# Patient Record
Sex: Female | Born: 1990
Health system: Southern US, Community
[De-identification: ages and names within clinical notes are randomized; demographics above are authoritative.]

## PROBLEM LIST (undated history)

## (undated) DIAGNOSIS — N921 Excessive and frequent menstruation with irregular cycle: Secondary | ICD-10-CM

## (undated) DIAGNOSIS — D169 Benign neoplasm of bone and articular cartilage, unspecified: Secondary | ICD-10-CM

## (undated) HISTORY — PX: BARIATRIC SURGERY: SHX1103

---
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
## 2011-05-08 ENCOUNTER — Emergency Department: Payer: Self-pay | Admitting: Emergency Medicine

## 2011-05-08 LAB — COMPREHENSIVE METABOLIC PANEL
Albumin: 3.6 g/dL (ref 3.4–5.0)
Alkaline Phosphatase: 100 U/L (ref 50–136)
BUN: 9 mg/dL (ref 7–18)
Bilirubin,Total: 0.3 mg/dL (ref 0.2–1.0)
Chloride: 109 mmol/L — ABNORMAL HIGH (ref 98–107)
Co2: 24 mmol/L (ref 21–32)
Creatinine: 0.64 mg/dL (ref 0.60–1.30)
EGFR (African American): >60
Glucose: 99 mg/dL (ref 65–99)
Osmolality: 280 (ref 275–301)
SGOT(AST): 17 U/L (ref 15–37)
SGPT (ALT): 25 U/L
Total Protein: 7.5 g/dL (ref 6.4–8.2)

## 2011-05-08 LAB — CBC
HCT: 38.8 % (ref 35.0–47.0)
HGB: 12.3 g/dL (ref 12.0–16.0)
MCHC: 31.6 g/dL — ABNORMAL LOW (ref 32.0–36.0)
RBC: 4.53 10*6/uL (ref 3.80–5.20)
WBC: 18.2 10*3/uL — ABNORMAL HIGH (ref 3.6–11.0)

## 2011-05-08 LAB — TROPONIN I: Troponin-I: <0.02 ng/mL

## 2011-05-22 ENCOUNTER — Emergency Department: Payer: Self-pay | Admitting: Emergency Medicine

## 2011-05-22 LAB — URINALYSIS, COMPLETE
Bilirubin,UR: NEGATIVE
Blood: NEGATIVE
Glucose,UR: NEGATIVE mg/dL (ref 0–75)
Ketone: NEGATIVE
Leukocyte Esterase: NEGATIVE
Nitrite: NEGATIVE
Ph: 5 (ref 4.5–8.0)
Protein: NEGATIVE
RBC,UR: 1 /HPF (ref 0–5)
Specific Gravity: 1.039 (ref 1.003–1.030)
Squamous Epithelial: 7
WBC UR: 2 /HPF (ref 0–5)

## 2011-05-22 LAB — DRUG SCREEN, URINE
Amphetamines, Ur Screen: NEGATIVE (ref ?–1000)
Barbiturates, Ur Screen: NEGATIVE (ref ?–200)
Cannabinoid 50 Ng, Ur ~~LOC~~: NEGATIVE (ref ?–50)
Cocaine Metabolite,Ur ~~LOC~~: NEGATIVE (ref ?–300)
MDMA (Ecstasy)Ur Screen: NEGATIVE (ref ?–500)
Methadone, Ur Screen: NEGATIVE (ref ?–300)
Opiate, Ur Screen: NEGATIVE (ref ?–300)
Phencyclidine (PCP) Ur S: NEGATIVE (ref ?–25)
Tricyclic, Ur Screen: NEGATIVE (ref ?–1000)

## 2011-05-22 LAB — COMPREHENSIVE METABOLIC PANEL
Albumin: 3.4 g/dL (ref 3.4–5.0)
Anion Gap: 7 (ref 7–16)
BUN: 11 mg/dL (ref 7–18)
Chloride: 107 mmol/L (ref 98–107)
Creatinine: 0.58 mg/dL — ABNORMAL LOW (ref 0.60–1.30)
EGFR (African American): >60
Glucose: 101 mg/dL — ABNORMAL HIGH (ref 65–99)
Osmolality: 277 (ref 275–301)
Sodium: 139 mmol/L (ref 136–145)
Total Protein: 7.4 g/dL (ref 6.4–8.2)

## 2011-05-22 LAB — CBC
HCT: 37.5 % (ref 35.0–47.0)
MCH: 27.4 pg (ref 26.0–34.0)
MCHC: 32.7 g/dL (ref 32.0–36.0)
MCV: 84 fL (ref 80–100)
WBC: 15.2 10*3/uL — ABNORMAL HIGH (ref 3.6–11.0)

## 2011-09-23 ENCOUNTER — Emergency Department: Payer: Self-pay | Admitting: Emergency Medicine

## 2013-02-06 ENCOUNTER — Emergency Department: Payer: Self-pay | Admitting: Emergency Medicine

## 2013-03-18 ENCOUNTER — Emergency Department: Payer: Self-pay | Admitting: Emergency Medicine

## 2013-06-16 ENCOUNTER — Ambulatory Visit: Payer: Self-pay

## 2013-06-27 ENCOUNTER — Emergency Department: Payer: Self-pay | Admitting: Emergency Medicine

## 2013-08-06 ENCOUNTER — Emergency Department: Payer: Self-pay | Admitting: Emergency Medicine

## 2014-04-29 NOTE — Consult Note (Signed)
PATIENT NAME:  Krystal Willis, Krystal Willis MR#:  [id number] DATE OF BIRTH:  1990-06-10  DATE OF CONSULTATION:  05/22/2011  REFERRING PHYSICIAN: Arman Filter, MD CONSULTING PHYSICIAN:  Niklas Chretien K. Nicolasa Ducking, MD  REASON FOR CONSULTATION: Rule out panic attacks.   IDENTIFYING INFORMATION: Krystal Willis is a 24 year old single African American female currently living with her mother in the [place] area. She is unemployed and does not have any children. She has been in a relationship on and off with another female over the past two years.   HISTORY OF PRESENT ILLNESS: Krystal Willis is a 24 year old single African American female with no significant past psychiatric history who was brought to the Emergency Room by EMS for the second time in the past month after hyperventilating and having a syncopal episode at home. The patient says that she had two episodes today where she felt like she couldn't breathe and "passed out". Her family said that she was unconscious and she has no recollection of the event. She had apparently hyperventilated and had shortness of breath prior to passing out. She says that she did have some chest pain and her heart was racing, but she denies feeling anxious prior to the episode. She says that one episode today lasted 15 to 20 minutes and another 20 to 30 minutes. The patient denies any specific triggers for these episodes and says that she was fairly calm throughout the episode. She had one episode similar to this approximately two weeks ago and was brought to the Emergency Room. She was discharged at that time with an inhaler and told that she had some small bronchitis. The patient denies feeling depressed and denies any symptoms of depression including feelings of hopelessness or helplessness, crying spells, difficulties with focus and concentration, anhedonia, change in energy level, or change in appetite. She has had some difficulty sleeping at night for the past several months. She is  morbidly obese but has not ever had a work-up for sleep apnea. The patient denies any history of any prior suicidal thoughts or suicide attempts. She denies any history in the past of anxiety or panic attacks. She denies any psychotic symptoms including auditory or visual hallucinations. She denies any paranoid thoughts or delusions. She has recently lost her job one month ago at [employer] secondary to the economy, but denies that she felt anxious or under any stress from this. She has also been on and off with her girlfriend for the past few months even though they have had a relationship for the past two years. She said she does not worry about this at all and her family actually says that she does not seem to care so much about not working or the problems in the relationship. Psychiatry was consulted to rule out panic attacks. Collateral information from the patient's family indicates that she has not been struggling with any anxiety or panic attacks at home. She denies any illicit drug use or heavy alcohol use. White blood cell count in the Emergency Room was 15.2 and urine tox screen was negative. Ethanol level was not drawn.   PAST PSYCHIATRIC HISTORY: She denies any history of any prior inpatient psychiatric hospitalizations or suicide attempts. She denies any history of any psychotropic medications for mood stability in the past. She denies any heavy substance use in the past.   SUBSTANCE ABUSE HISTORY: She denies any cocaine, opiate, or stimulant use. She says she tried marijuana before in the past but does not like it. She drinks alcohol,  1 to 2 drinks per week. The patient does smoke cigars on and off about once a week. She denies any cigarette use.   FAMILY PSYCHIATRIC HISTORY: She denies any history of any mental illness or substance use in the family.   PAST MEDICAL HISTORY: Obesity. Rule out sleep apnea. She denied a history of any prior TBI or seizures.   OUTPATIENT MEDICATIONS: Inhaler for  bronchitis that was prescribed in the Emergency Room on 05/04, Ventolin inhaler, azithromycin prescribed 05/04.   ALLERGIES: No known drug allergies.   SOCIAL HISTORY: The patient was born and raised in the [place] area by her mother primarily. She knew her father, but her parents were not married. She says she gets along with both of her parents. She has two little sisters, age 99 and 63. She denies any history of any physical or sexual abuse. The patient graduated high school. She did one semester at [employer] and is planning to go to [employer] and study criminal justice. She wants to be a cop. She last worked one month ago at [employer]. She has been in a relationship with another female for the past two years, but they have been on and off over the past several months. She lives with her mother.   LEGAL HISTORY: She denies any history of any arrests or incarcerations.   MENTAL STATUS EXAM: Krystal Willis is a 24 year old obese African American female who is lying fairly comfortably and calmly in her stretcher in the Emergency Room. Her family was at the bedside. She was fully alert and oriented to time, place, and situation. Speech was regular rate and rhythm, fluent and coherent. Mood was described as being "okay" and affect was full and congruent. The patient was able to smile and joke appropriately. Speech was regular rate and rhythm, fluent and coherent. Thought processes were linear, logical, and goal-directed. There was no alteration in mental status. Cognition was grossly intact. She denied any current suicidal or homicidal thoughts. She denied any current auditory or visual hallucinations. She denied any paranoid thoughts or delusions. Recall was 3/3 initially and 3/3 after five minutes. Abstraction was good. She did not have any difficulty naming presidents backwards.   SUICIDE RISK ASSESSMENT: At this time Ms. Intrieri denies any current suicidal thoughts or homicidal thoughts. She denies any current psychotic  symptoms. She does not present as an imminent danger to herself or others. She denies having any access to guns.   REVIEW OF SYSTEMS: CONSTITUTIONAL: She denies any weight changes, fatigue or weakness. She denies any fever, chills, or night sweats. HEAD: She denies any headaches or dizziness. EYES: She denies any diplopia or blurred vision. ENT: She denies any throat pain. She denies any neck pain. She denies any difficulty swallowing. RESPIRATORY: She did have shortness of breath and chest pain earlier prior to the episode. CARDIAC: She did have chest pain and shortness of breath prior to the episode. She denies ever having syncopal episodes prior to today and two weeks ago. GASTROINTESTINAL: She denies any nausea, vomiting, or abdominal pain. She denies any change in bowel movements. GENITOURINARY: She denies any dysuria. She denies any difficulty with incontinence. NEUROLOGICAL: She denies any numbness, but did have some tingling in her upper extremities earlier today when she was hyperventilating. PSYCHIATRIC: Please see history of present illness. She denies any anxiety or depressive symptoms. She denies any psychosis.   PHYSICAL EXAMINATION:  VITAL SIGNS: Blood pressure 101/56, heart rate 77, respirations 18, temperature 98.9, pulse oximetry 98% on room  air. Please see initial physical exam as completed by Dr. Arman Filter.   LABORATORY, DIAGNOSTIC, AND RADIOLOGICAL DATA: Sodium 139, potassium 4.0, chloride 107, CO2 25. BUN 11, creatinine 0.58, glucose 101. Beta HCG less than 1. LFT within normal limits. Troponin less than 0.02. White blood cell count 15.2, hemoglobin 12.2, platelet count 318. Urine tox screen negative for all substances. Urinalysis was nitrate and leukocyte esterase negative, 2 WBC, trace bacteria, 7 epithelial cells.   DIAGNOSES:  AXIS I: No Axis I diagnosis. Rule out anxiety.   AXIS II: Deferred.   AXIS III:  1. Obesity.  2. Rule out syncopal episode.  3. Rule out sleep  apnea.   AXIS IV: Mild to moderate occupational problems, some relationship conflict.   AXIS V: Global assessment of functioning score at present equals 70.  ASSESSMENT AND TREATMENT RECOMMENDATIONS: Ms. Pickler is a 24 year old single African American female who was brought to the Emergency Room after she passed out twice today. The patient did have some hyperventilation prior to passing out and had complained of some chest pain and shortness of breath. She was seen in the Emergency Room on 05/04, two weeks ago and prescribed an inhaler for acute bronchitis. The patient is denying any depressive symptoms, suicidal thoughts or psychotic symptoms. No history of symptoms consistent with bipolar mania. Her description of the events is not strongly suggestive of a panic attack and the patient is denying any triggers for these attacks. Collateral information from the family confirms that the patient has not been under any stress and they have not seen her struggling with any anxiety or psychiatric issues. Would consult cardiology to rule out cardiovascular etiology for syncopal episodes. No psychotropic medication recommendations at this time. The case was discussed with Dr. Jasmine December in the Emergency Room. She is not in imminent danger to herself or others at this time necessitating any inpatient psychiatric hospitalization.   ____________________________ Steva Colder. Nicolasa Ducking, MD akk:ap D: 05/22/2011 18:59:35 ET T: 05/23/2011 08:02:37 ET JOB#: [id number]  cc: Tiphany Fayson K. Nicolasa Ducking, MD, <Dictator> Chauncey Mann MD ELECTRONICALLY SIGNED 05/24/2011 22:47

## 2014-05-09 ENCOUNTER — Emergency Department
Admission: EM | Admit: 2014-05-09 | Discharge: 2014-05-09 | Disposition: A | Discharge status: Home / Self Care | Payer: No Typology Code available for payment source | Attending: Emergency Medicine | Admitting: Emergency Medicine

## 2014-05-09 DIAGNOSIS — Z3202 Encounter for pregnancy test, result negative: Secondary | ICD-10-CM | POA: Diagnosis not present

## 2014-05-09 DIAGNOSIS — N939 Abnormal uterine and vaginal bleeding, unspecified: Secondary | ICD-10-CM | POA: Diagnosis not present

## 2014-05-09 LAB — COMPREHENSIVE METABOLIC PANEL
ALT: 20 U/L (ref 14–54)
ANION GAP: 6 (ref 5–15)
AST: 17 U/L (ref 15–41)
Albumin: 3.8 g/dL (ref 3.5–5.0)
Alkaline Phosphatase: 89 U/L (ref 38–126)
BUN: 7 mg/dL (ref 6–20)
CALCIUM: 8.6 mg/dL — AB (ref 8.9–10.3)
CO2: 25 mmol/L (ref 22–32)
CREATININE: 0.63 mg/dL (ref 0.44–1.00)
Chloride: 105 mmol/L (ref 101–111)
GLUCOSE: 105 mg/dL — AB (ref 65–99)
Potassium: 3.8 mmol/L (ref 3.5–5.1)
Sodium: 136 mmol/L (ref 135–145)
Total Bilirubin: 0.3 mg/dL (ref 0.3–1.2)
Total Protein: 8 g/dL (ref 6.5–8.1)

## 2014-05-09 LAB — POCT PREGNANCY, URINE: Preg Test, Ur: NEGATIVE

## 2014-05-09 LAB — CBC
HCT: 31.8 % — ABNORMAL LOW (ref 35.0–47.0)
HEMOGLOBIN: 10.2 g/dL — AB (ref 12.0–16.0)
MCH: 25.1 pg — AB (ref 26.0–34.0)
MCHC: 32 g/dL (ref 32.0–36.0)
MCV: 78.6 fL — ABNORMAL LOW (ref 80.0–100.0)
PLATELETS: 374 10*3/uL (ref 150–440)
RBC: 4.05 MIL/uL (ref 3.80–5.20)
RDW: 15.1 % — ABNORMAL HIGH (ref 11.5–14.5)
WBC: 16.4 10*3/uL — ABNORMAL HIGH (ref 3.6–11.0)

## 2014-05-09 LAB — URINALYSIS COMPLETE WITH MICROSCOPIC (ARMC ONLY)
Bilirubin Urine: NEGATIVE
GLUCOSE, UA: NEGATIVE mg/dL
Ketones, ur: NEGATIVE mg/dL
Leukocytes, UA: NEGATIVE
Nitrite: NEGATIVE
Protein, ur: 30 mg/dL — AB
Specific Gravity, Urine: 1.024 (ref 1.005–1.030)
pH: 5 (ref 5.0–8.0)

## 2014-05-09 LAB — TSH: TSH: 2.684 u[IU]/mL (ref 0.350–4.500)

## 2014-05-09 MED ORDER — MEDROXYPROGESTERONE ACETATE 10 MG PO TABS: 10.0000 mg | ORAL_TABLET | Freq: Every day | ORAL | Status: DC

## 2014-05-09 MED ORDER — MEDROXYPROGESTERONE ACETATE 10 MG PO TABS
10.0000 mg | ORAL_TABLET | Freq: Every day | ORAL | Status: DC
  Administered 2014-05-09: 10 mg via ORAL
  Filled 2014-05-09 (×2): qty 1

## 2014-05-09 NOTE — ED Notes (Signed)
Reports vaginal bleeding x 1 month, denies pain

## 2014-05-09 NOTE — ED Provider Notes (Signed)
[facility] Emergency Department Provider Note    Time seen: 1:37 PM  I have reviewed the triage vital signs and the nursing notes.   HISTORY  Chief Complaint Vaginal Bleeding    HPI Krystal Willis is a 24 y.o. female since ER four-month vaginal bleeding. Denies any pain denies any other complaints. She states typically her menstrual cycles are irregular but she has never bled for this long. Has never been on birth control. Just does describe recent weight gain. Bleeding is moderate    No past medical history on file.  There are no active problems to display for this patient.   No past surgical history on file.  No current outpatient prescriptions on file.  Allergies Review of patient's allergies indicates no known allergies.  No family history on file.  Social History History  Substance Use Topics  . Smoking status: Not on file  . Smokeless tobacco: Not on file  . Alcohol Use: Not on file    Review of Systems Constitutional: Negative for fever. Eyes: Negative for visual changes. ENT: Negative for sore throat. Cardiovascular: Negative for chest pain. Respiratory: Negative for shortness of breath. Gastrointestinal: Negative for abdominal pain, vomiting and diarrhea. Genitourinary: Negative for dysuria, persistent vaginal bleeding Musculoskeletal: Negative for back pain. Skin: Negative for rash. Neurological: Negative for headaches, focal weakness or numbness.  10-point ROS otherwise negative.  ____________________________________________   PHYSICAL EXAM:  VITAL SIGNS: ED Triage Vitals  Enc Vitals Group     BP 05/09/14 1851 140/87 mmHg     Pulse Rate 05/09/14 1851 97     Resp --      Temp 05/09/14 1851 98.3 F (36.8 C)     Temp Source 05/09/14 1851 Oral     SpO2 05/09/14 1851 97 %     Weight 05/09/14 1853 370 lb (167.831 kg)     Height 05/09/14 1853 5\' 5"  (1.651 m)     Head Cir --      Peak Flow --      Pain Score --      Pain Loc --      Pain Edu? --      Excl. in [place]? --     Constitutional: Alert and oriented. Obese but well appearing and in no distress Eyes: Conjunctivae are normal. PERRL. Normal extraocular movements. ENT   Head: Normocephalic and atraumatic.   Nose: No congestion/rhinnorhea.   Mouth/Throat: Mucous membranes are moist.   Neck: No stridor. Hematological/Lymphatic/Immunilogical: No cervical lymphadenopathy. Cardiovascular: Normal rate, regular rhythm. Normal and symmetric distal pulses are present in all extremities. No murmurs, rubs, or gallops. Respiratory: Normal respiratory effort without tachypnea nor retractions. Breath sounds are clear and equal bilaterally. No wheezes/rales/rhonchi. Gastrointestinal: Soft and nontender. No distention. No abdominal bruits. There is no CVA tenderness. Musculoskeletal: Nontender with normal range of motion in all extremities. No joint effusions.  No lower extremity tenderness nor edema. Neurologic:  Normal speech and language. No gross focal neurologic deficits are appreciated. Speech is normal. No gait instability. Skin:  Skin is warm, dry and intact. No rash noted.  ____________________________________________    LABS (pertinent positives/negatives)  Chronic leukocytosis hemoglobin 10.2  ____________________________________________    RADIOLOGY  None  ____________________________________________    ED COURSE  Pertinent labs & imaging results that were available during my care of the patient were reviewed by me and considered in my medical decision making (see chart for details).  Patient looks well. She was given Provera 10 mg by  mouth  FINAL ASSESSMENT AND PLAN  Assessment: Dysfunctional uterine bleeding  Plan: We'll discharge on 10 day course of Provera. Encouraged follow-up with OB/GYN.    Earleen Newport, MD   Earleen Newport, MD 05/09/14 2114

## 2014-05-09 NOTE — Discharge Instructions (Signed)

## 2014-05-31 ENCOUNTER — Emergency Department
Admission: EM | Admit: 2014-05-31 | Discharge: 2014-05-31 | Disposition: A | Discharge status: Home / Self Care | Payer: Self-pay | Attending: Emergency Medicine | Admitting: Emergency Medicine

## 2014-05-31 DIAGNOSIS — Z3202 Encounter for pregnancy test, result negative: Secondary | ICD-10-CM | POA: Insufficient documentation

## 2014-05-31 DIAGNOSIS — N921 Excessive and frequent menstruation with irregular cycle: Secondary | ICD-10-CM | POA: Insufficient documentation

## 2014-05-31 LAB — CBC WITH DIFFERENTIAL/PLATELET
Basophils Absolute: 0 10*3/uL (ref 0–0.1)
Basophils Relative: 0 %
EOS ABS: 0.1 10*3/uL (ref 0–0.7)
Eosinophils Relative: 1 %
HEMATOCRIT: 29.1 % — AB (ref 35.0–47.0)
Hemoglobin: 9.3 g/dL — ABNORMAL LOW (ref 12.0–16.0)
LYMPHS ABS: 4 10*3/uL — AB (ref 1.0–3.6)
Lymphocytes Relative: 27 %
MCH: 25.1 pg — ABNORMAL LOW (ref 26.0–34.0)
MCHC: 32.1 g/dL (ref 32.0–36.0)
MCV: 78.1 fL — ABNORMAL LOW (ref 80.0–100.0)
MONO ABS: 0.6 10*3/uL (ref 0.2–0.9)
Monocytes Relative: 4 %
Neutro Abs: 10.2 10*3/uL — ABNORMAL HIGH (ref 1.4–6.5)
Neutrophils Relative %: 68 %
Platelets: 374 10*3/uL (ref 150–440)
RBC: 3.73 MIL/uL — ABNORMAL LOW (ref 3.80–5.20)
RDW: 15.5 % — ABNORMAL HIGH (ref 11.5–14.5)
WBC: 14.9 10*3/uL — ABNORMAL HIGH (ref 3.6–11.0)

## 2014-05-31 LAB — POCT PREGNANCY, URINE: Preg Test, Ur: NEGATIVE

## 2014-05-31 LAB — URINALYSIS COMPLETE WITH MICROSCOPIC (ARMC ONLY)
SQUAMOUS EPITHELIAL / LPF: NONE SEEN
Specific Gravity, Urine: 1.033 — ABNORMAL HIGH (ref 1.005–1.030)

## 2014-05-31 MED ORDER — MEDROXYPROGESTERONE ACETATE 10 MG PO TABS: 10.0000 mg | ORAL_TABLET | Freq: Every day | ORAL | Status: DC

## 2014-05-31 MED ORDER — SULFAMETHOXAZOLE-TRIMETHOPRIM 800-160 MG PO TABS: 1.0000 | ORAL_TABLET | Freq: Two times a day (BID) | ORAL | Status: DC

## 2014-05-31 NOTE — ED Notes (Signed)
Patient states that she has been on her menstrual cycle for 3 months. Reports some mild abdominal pain.

## 2014-05-31 NOTE — ED Provider Notes (Addendum)
[facility] Emergency Department Provider Note  ____________________________________________  Time seen: 3:30 PM  I have reviewed the triage vital signs and the nursing notes.   HISTORY  Chief Complaint Vaginal Bleeding    HPI Krystal Willis is a 24 y.o. female who reports continuous menstrual bleeding for about 3 months. She was seen in the ED on May 5 and started on a course of Provera.She took this for a total of 10 days and noted that the bleeding stopped while taking it, and then when she ran out of the medication the bleeding started again. It is not been going on for 12 days and getting worse. She's noted a few small clots. No dizziness lightheadedness passing out chest pain shortness of breath fevers or chills.     History reviewed. No pertinent past medical history.  There are no active problems to display for this patient.   History reviewed. No pertinent past surgical history.  Current Outpatient Rx  Name  Route  Sig  Dispense  Refill  . medroxyPROGESTERone (PROVERA) 10 MG tablet   Oral   Take 1 tablet (10 mg total) by mouth daily.   10 tablet   0   . medroxyPROGESTERone (PROVERA) 10 MG tablet   Oral   Take 1 tablet (10 mg total) by mouth daily.   10 tablet   0     Allergies Review of patient's allergies indicates no known allergies.  No family history on file.  Social History History  Substance Use Topics  . Smoking status: Never Smoker   . Smokeless tobacco: Not on file  . Alcohol Use: No    Review of Systems  Constitutional: No fever or chills. No weight changes Eyes:No blurry vision or double vision.  ENT: No sore throat. Cardiovascular: No chest pain. Respiratory: No dyspnea or cough. Gastrointestinal: Negative for abdominal pain, vomiting and diarrhea.  No BRBPR or melena. Genitourinary: No dysuria but does have urinary frequency. Positive vaginal bleeding Musculoskeletal: Negative for back pain. No joint  swelling or pain. Skin: Negative for rash. Neurological: Negative for headaches, focal weakness or numbness. Psychiatric:No anxiety or depression.   Endocrine:No hot/cold intolerance, changes in energy, or sleep difficulty.  10-point ROS otherwise negative.  ____________________________________________   PHYSICAL EXAM:  VITAL SIGNS: ED Triage Vitals  Enc Vitals Group     BP 05/31/14 1509 157/93 mmHg     Pulse Rate 05/31/14 1509 116     Resp 05/31/14 1509 15     Temp 05/31/14 1509 98.3 F (36.8 C)     Temp Source 05/31/14 1509 Oral     SpO2 05/31/14 1509 98 %     Weight 05/31/14 1509 320 lb (145.151 kg)     Height 05/31/14 1509 5\' 5"  (1.651 m)     Head Cir --      Peak Flow --      Pain Score 05/31/14 1510 5     Pain Loc --      Pain Edu? --      Excl. in [place]? --      Constitutional: Alert and oriented. Well appearing and in no distress. Eyes: No scleral icterus. No conjunctival pallor. PERRL. EOMI ENT   Head: Normocephalic and atraumatic.   Nose: No congestion/rhinnorhea. No septal hematoma   Mouth/Throat: MMM, no pharyngeal erythema. No peritonsillar mass. No uvula shift.   Neck: No stridor. No SubQ emphysema. No meningismus. Acanthosis nigricans Hematological/Lymphatic/Immunilogical: No cervical lymphadenopathy. Cardiovascular: RRR. Normal and symmetric distal pulses  are present in all extremities. No murmurs, rubs, or gallops. Respiratory: Normal respiratory effort without tachypnea nor retractions. Breath sounds are clear and equal bilaterally. No wheezes/rales/rhonchi. Gastrointestinal: Soft and nontender. No distention. There is no CVA tenderness.  No rebound, rigidity, or guarding. Genitourinary: deferred Musculoskeletal: Nontender with normal range of motion in all extremities. No joint effusions.  No lower extremity tenderness.  No edema. Neurologic:   Normal speech and language.  CN 2-10 normal. Motor grossly intact. No pronator drift.  Normal  gait. No gross focal neurologic deficits are appreciated.  Skin:  Skin is warm, dry and intact. No rash noted.  No petechiae, purpura, or bullae. Psychiatric: Mood and affect are normal. Speech and behavior are normal. Patient exhibits appropriate insight and judgment.  ____________________________________________    LABS (pertinent positives/negatives) (all labs ordered are listed, but only abnormal results are displayed) Labs Reviewed  CBC WITH DIFFERENTIAL/PLATELET - Abnormal; Notable for the following:    WBC 14.9 (*)    RBC 3.73 (*)    Hemoglobin 9.3 (*)    HCT 29.1 (*)    MCV 78.1 (*)    MCH 25.1 (*)    RDW 15.5 (*)    Neutro Abs 10.2 (*)    Lymphs Abs 4.0 (*)    All other components within normal limits  URINALYSIS COMPLETEWITH MICROSCOPIC ([facility] ONLY) - Abnormal; Notable for the following:    Bacteria, UA FEW (*)    All other components within normal limits  POC URINE PREG, ED  POCT PREGNANCY, URINE   ____________________________________________   EKG    ____________________________________________    RADIOLOGY    ____________________________________________   PROCEDURES  ____________________________________________   INITIAL IMPRESSION / ASSESSMENT AND PLAN / ED COURSE  Pertinent labs & imaging results that were available during my care of the patient were reviewed by me and considered in my medical decision making (see chart for details).  Evaluation consisted consistent with menometrorrhagia. The patient has called the [facility] of [facility] GYN clinic for follow-up and is awaiting an appointment. She otherwise is well-appearing no acute distress, does have some features suggestive of PCO S, which will be best managed by the OB/GYN clinic. I'll start her on another course of Provera for symptom control at this time and let her follow-up with gynecology. No evidence of ectopic pregnancy or pregnancy test is negative. We'll also give Bactrim for  UTI.  ____________________________________________   FINAL CLINICAL IMPRESSION(S) / ED DIAGNOSES  Final diagnoses:  Metrorrhagia   urinary tract infection    Carrie Mew, MD 05/31/14 [place], MD 05/31/14 [phone]

## 2014-05-31 NOTE — ED Notes (Signed)
Pt alert and oriented X4, active, cooperative, pt in NAD. RR even and unlabored, color WNL.  Pt informed to return if any life threatening symptoms occur.   

## 2014-05-31 NOTE — Discharge Instructions (Signed)

## 2014-06-03 LAB — URINE CULTURE: Culture: 5000

## 2014-06-11 ENCOUNTER — Encounter: Payer: Self-pay | Admitting: Emergency Medicine

## 2014-06-11 ENCOUNTER — Emergency Department
Admission: EM | Admit: 2014-06-11 | Discharge: 2014-06-11 | Disposition: A | Discharge status: Home / Self Care | Payer: Self-pay | Attending: Emergency Medicine | Admitting: Emergency Medicine

## 2014-06-11 DIAGNOSIS — N939 Abnormal uterine and vaginal bleeding, unspecified: Secondary | ICD-10-CM

## 2014-06-11 DIAGNOSIS — D500 Iron deficiency anemia secondary to blood loss (chronic): Secondary | ICD-10-CM

## 2014-06-11 DIAGNOSIS — Z792 Long term (current) use of antibiotics: Secondary | ICD-10-CM | POA: Insufficient documentation

## 2014-06-11 DIAGNOSIS — Z793 Long term (current) use of hormonal contraceptives: Secondary | ICD-10-CM | POA: Insufficient documentation

## 2014-06-11 DIAGNOSIS — Z87891 Personal history of nicotine dependence: Secondary | ICD-10-CM | POA: Insufficient documentation

## 2014-06-11 LAB — CBC WITH DIFFERENTIAL/PLATELET
Basophils Absolute: 0 10*3/uL (ref 0–0.1)
Basophils Relative: 0 %
EOS ABS: 0.3 10*3/uL (ref 0–0.7)
EOS PCT: 2 %
HCT: 28.2 % — ABNORMAL LOW (ref 35.0–47.0)
Hemoglobin: 8.6 g/dL — ABNORMAL LOW (ref 12.0–16.0)
Lymphocytes Relative: 28 %
Lymphs Abs: 3.8 10*3/uL — ABNORMAL HIGH (ref 1.0–3.6)
MCH: 23.6 pg — AB (ref 26.0–34.0)
MCHC: 30.4 g/dL — ABNORMAL LOW (ref 32.0–36.0)
MCV: 77.5 fL — ABNORMAL LOW (ref 80.0–100.0)
MONOS PCT: 5 %
Monocytes Absolute: 0.6 10*3/uL (ref 0.2–0.9)
NEUTROS PCT: 65 %
Neutro Abs: 8.9 10*3/uL — ABNORMAL HIGH (ref 1.4–6.5)
Platelets: 445 10*3/uL — ABNORMAL HIGH (ref 150–440)
RBC: 3.64 MIL/uL — ABNORMAL LOW (ref 3.80–5.20)
RDW: 15.6 % — ABNORMAL HIGH (ref 11.5–14.5)
WBC: 13.6 10*3/uL — ABNORMAL HIGH (ref 3.6–11.0)

## 2014-06-11 MED ORDER — OXYCODONE-ACETAMINOPHEN 5-325 MG PO TABS: 1.0000 | ORAL_TABLET | Freq: Four times a day (QID) | ORAL | Status: DC | PRN

## 2014-06-11 MED ORDER — OXYCODONE-ACETAMINOPHEN 5-325 MG PO TABS
ORAL_TABLET | ORAL | Status: AC
  Administered 2014-06-11: 2 via ORAL
  Filled 2014-06-11: qty 2

## 2014-06-11 MED ORDER — OXYCODONE-ACETAMINOPHEN 5-325 MG PO TABS
2.0000 | ORAL_TABLET | Freq: Once | ORAL | Status: AC
  Administered 2014-06-11: 2 via ORAL

## 2014-06-11 MED ORDER — NORGESTIMATE-ETH ESTRADIOL 0.25-35 MG-MCG PO TABS: 1.0000 | ORAL_TABLET | Freq: Every day | ORAL | Status: DC

## 2014-06-11 NOTE — ED Notes (Signed)
States has had vag bleed x 1 month, has been seen here before and RX meds but has not stopped, denies chance of pregnancy

## 2014-06-11 NOTE — Discharge Instructions (Signed)
Abnormal Uterine Bleeding Abnormal uterine bleeding can affect women at various stages in life, including teenagers, women in their reproductive years, pregnant women, and women who have reached menopause. Several kinds of uterine bleeding are considered abnormal, including:  Bleeding or spotting between periods.   Bleeding after sexual intercourse.   Bleeding that is heavier or more than normal.   Periods that last longer than usual.  Bleeding after menopause.  Many cases of abnormal uterine bleeding are minor and simple to treat, while others are more serious. Any type of abnormal bleeding should be evaluated by your health care provider. Treatment will depend on the cause of the bleeding. HOME CARE INSTRUCTIONS Monitor your condition for any changes. The following actions may help to alleviate any discomfort you are experiencing:  Avoid the use of tampons and douches as directed by your health care provider.  Change your pads frequently. You should get regular pelvic exams and Pap tests. Keep all follow-up appointments for diagnostic tests as directed by your health care provider.  SEEK MEDICAL CARE IF:   Your bleeding lasts more than 1 week.   You feel dizzy at times.  SEEK IMMEDIATE MEDICAL CARE IF:   You pass out.   You are changing pads every 15 to 30 minutes.   You have abdominal pain.  You have a fever.   You become sweaty or weak.   You are passing large blood clots from the vagina.   You start to feel nauseous and vomit. MAKE SURE YOU:   Understand these instructions.  Will watch your condition.  Will get help right away if you are not doing well or get worse. Document Released: 12/22/2004 Document Revised: 12/27/2012 Document Reviewed: 07/21/2012 [employer] Patient Information 2015 [employer]. This information is not intended to replace advice given to you by your health care provider. Make sure you discuss any questions you have with your  health care provider.  

## 2014-06-11 NOTE — ED Provider Notes (Signed)
[facility] Emergency Department Provider Note     Time seen: ----------------------------------------- 2:20 PM on 06/11/2014 -----------------------------------------    I have reviewed the triage vital signs and the nursing notes.   HISTORY  Chief Complaint Vaginal Bleeding    HPI Krystal Willis is a 24 y.o. female who presents ER for heavy bleeding for last month. Patient states this is her third visit here for same. She's had 2 rounds of Provera, bleeding initially stopped but then recur. Patient denies any chance she is pregnant, having severe pelvic pain this time. Nothing seems to make her symptoms better or worse. Denies fevers chills or other complaints. States that she did have a UTI last time as well, the medication she was given last admitted her very nauseated     History reviewed. No pertinent past medical history.  There are no active problems to display for this patient.   No past surgical history on file.  Current Outpatient Rx  Name  Route  Sig  Dispense  Refill  . medroxyPROGESTERone (PROVERA) 10 MG tablet   Oral   Take 1 tablet (10 mg total) by mouth daily.   10 tablet   0   . medroxyPROGESTERone (PROVERA) 10 MG tablet   Oral   Take 1 tablet (10 mg total) by mouth daily.   10 tablet   0   . sulfamethoxazole-trimethoprim (BACTRIM DS) 800-160 MG per tablet   Oral   Take 1 tablet by mouth 2 (two) times daily.   14 tablet   0     Allergies Review of patient's allergies indicates no known allergies.  No family history on file.  Social History History  Substance Use Topics  . Smoking status: Former Research scientist (life sciences)  . Smokeless tobacco: Not on file  . Alcohol Use: No    Review of Systems Constitutional: Negative for fever. Eyes: Negative for visual changes. ENT: Negative for sore throat. Cardiovascular: Negative for chest pain. Respiratory: Negative for shortness of breath. Gastrointestinal: Negative for abdominal  pain, vomiting and diarrhea. Genitourinary: Negative for dysuria. Positive for persistent heavy vaginal bleeding and passing clots Musculoskeletal: Negative for back pain. Skin: Negative for rash. Neurological: Negative for headaches, focal weakness or numbness.  10-point ROS otherwise negative.  ____________________________________________   PHYSICAL EXAM:  VITAL SIGNS: ED Triage Vitals  Enc Vitals Group     BP 06/11/14 1150 138/86 mmHg     Pulse Rate 06/11/14 1150 78     Resp 06/11/14 1150 20     Temp 06/11/14 1150 98.5 F (36.9 C)     Temp Source 06/11/14 1150 Oral     SpO2 06/11/14 1150 100 %     Weight 06/11/14 1150 320 lb (145.151 kg)     Height 06/11/14 1150 5\' 6"  (1.676 m)     Head Cir --      Peak Flow --      Pain Score 06/11/14 1152 10     Pain Loc --      Pain Edu? --      Excl. in [place]? --     Constitutional: Alert and oriented. Well appearing and in no distress. Eyes: Conjunctivae are normal. PERRL. Normal extraocular movements. ENT   Head: Normocephalic and atraumatic.   Nose: No congestion/rhinnorhea.   Mouth/Throat: Mucous membranes are moist.   Neck: No stridor. Hematological/Lymphatic/Immunilogical: No cervical lymphadenopathy. Cardiovascular: Normal rate, regular rhythm. Normal and symmetric distal pulses are present in all extremities. No murmurs, rubs, or gallops. Respiratory: Normal  respiratory effort without tachypnea nor retractions. Breath sounds are clear and equal bilaterally. No wheezes/rales/rhonchi. Gastrointestinal: Soft and nontender. No distention. No abdominal bruits. There is no CVA tenderness. Musculoskeletal: Nontender with normal range of motion in all extremities. No joint effusions.  No lower extremity tenderness nor edema. Neurologic:  Normal speech and language. No gross focal neurologic deficits are appreciated. Speech is normal. No gait instability. Skin:  Skin is warm, dry and intact, pallor Psychiatric: Mood and  affect are normal. Speech and behavior are normal. Patient exhibits appropriate insight and judgment.  ____________________________________________    LABS (pertinent positives/negatives)  Labs Reviewed  CBC WITH DIFFERENTIAL/PLATELET - Abnormal; Notable for the following:    WBC 13.6 (*)    RBC 3.64 (*)    Hemoglobin 8.6 (*)    HCT 28.2 (*)    MCV 77.5 (*)    MCH 23.6 (*)    MCHC 30.4 (*)    RDW 15.6 (*)    Platelets 445 (*)    Neutro Abs 8.9 (*)    Lymphs Abs 3.8 (*)    All other components within normal limits   ____________________________________________  ED COURSE:  Pertinent labs & imaging results that were available during my care of the patient were reviewed by me and considered in my medical decision making (see chart for details).  Were discussed with OB/GYN for assistance in medicating this patient. She has been an unable to get in with OB/GYN doctor ____________________________________________   RADIOLOGY  None  ____________________________________________    FINAL ASSESSMENT AND PLAN  Abnormal vaginal bleeding and anemia  Plan: Discussed the case with Dr. Leafy Ro who is on-call for OB/GYN. Patient will be written for a Sprintec taper. Her hemoglobin has dropped, but not significantly since the past visit. She will follow-up in the next several days with Dr. Leafy Ro OB/GYN.    Earleen Newport, MD   Earleen Newport, MD 06/11/14 [phone]

## 2014-06-29 ENCOUNTER — Encounter: Payer: Self-pay | Admitting: Emergency Medicine

## 2014-06-29 ENCOUNTER — Emergency Department
Admission: EM | Admit: 2014-06-29 | Discharge: 2014-06-29 | Disposition: A | Discharge status: Home / Self Care | Payer: Self-pay | Attending: Emergency Medicine | Admitting: Emergency Medicine

## 2014-06-29 DIAGNOSIS — Z79899 Other long term (current) drug therapy: Secondary | ICD-10-CM | POA: Insufficient documentation

## 2014-06-29 DIAGNOSIS — N938 Other specified abnormal uterine and vaginal bleeding: Secondary | ICD-10-CM | POA: Insufficient documentation

## 2014-06-29 DIAGNOSIS — Z792 Long term (current) use of antibiotics: Secondary | ICD-10-CM | POA: Insufficient documentation

## 2014-06-29 DIAGNOSIS — Z87891 Personal history of nicotine dependence: Secondary | ICD-10-CM | POA: Insufficient documentation

## 2014-06-29 LAB — CBC
HCT: 25.6 % — ABNORMAL LOW (ref 35.0–47.0)
Hemoglobin: 8.4 g/dL — ABNORMAL LOW (ref 12.0–16.0)
MCH: 24.9 pg — AB (ref 26.0–34.0)
MCHC: 32.8 g/dL (ref 32.0–36.0)
MCV: 75.9 fL — ABNORMAL LOW (ref 80.0–100.0)
PLATELETS: 420 10*3/uL (ref 150–440)
RBC: 3.37 MIL/uL — ABNORMAL LOW (ref 3.80–5.20)
RDW: 15.8 % — AB (ref 11.5–14.5)
WBC: 12.1 10*3/uL — ABNORMAL HIGH (ref 3.6–11.0)

## 2014-06-29 MED ORDER — NORGESTIMATE-ETH ESTRADIOL 0.25-35 MG-MCG PO TABS: 1.0000 | ORAL_TABLET | Freq: Every day | ORAL | Status: DC

## 2014-06-29 NOTE — Discharge Instructions (Signed)

## 2014-06-29 NOTE — ED Provider Notes (Signed)
[facility] Emergency Department Provider Note  ____________________________________________  Time seen: On arrival  I have reviewed the triage vital signs and the nursing notes.   HISTORY  Chief Complaint Vaginal Bleeding     HPI Krystal Willis is a 24 y.o. female who presents with long history of chronic heavy bleeding. She was placed on birth control pills by one of our ED physicians earlier in the month and she had significant success with this with complete resolution of bleeding and cramping. She recently started taking the sugar pills and started bleeding again yesterday, she reports is moderate. She requests a refill of the medication. She has GYN follow-up in 1.5 months. She denies dizziness or lightheadedness. Her symptoms are the same as they have always been    History reviewed. No pertinent past medical history. Vaginal bleeding There are no active problems to display for this patient.   History reviewed. No pertinent past surgical history.  Current Outpatient Rx  Name  Route  Sig  Dispense  Refill  . medroxyPROGESTERone (PROVERA) 10 MG tablet   Oral   Take 1 tablet (10 mg total) by mouth daily.   10 tablet   0   . medroxyPROGESTERone (PROVERA) 10 MG tablet   Oral   Take 1 tablet (10 mg total) by mouth daily.   10 tablet   0   . norgestimate-ethinyl estradiol (SPRINTEC 28) 0.25-35 MG-MCG tablet   Oral   Take 1 tablet by mouth daily. Take one dose every 8 hours until bleeding stops. This typically will happen in first 48 hours. Once bleeding stops, take twice a day for 7 days, then daily.   1 Package   11   . oxyCODONE-acetaminophen (ROXICET) 5-325 MG per tablet   Oral   Take 1 tablet by mouth every 6 (six) hours as needed.   20 tablet   0   . sulfamethoxazole-trimethoprim (BACTRIM DS) 800-160 MG per tablet   Oral   Take 1 tablet by mouth 2 (two) times daily.   14 tablet   0     Allergies Review of patient's allergies  indicates no known allergies.  History reviewed. No pertinent family history.  Social History History  Substance Use Topics  . Smoking status: Former Research scientist (life sciences)  . Smokeless tobacco: Not on file  . Alcohol Use: No    Review of Systems  Constitutional: Negative for fever. Eyes: Negative for visual changes. ENT: Negative for sore throat Cardiovascular: Negative for chest pain. Respiratory: Negative for shortness of breath. Gastrointestinal: Negative for abdominal pain, vomiting and diarrhea. Genitourinary: Negative for dysuria. Positive for pelvic cramping Musculoskeletal: Negative for back pain. Skin: No pallor Neurological: No dizziness Psychiatric: No anxiety  10-point ROS otherwise negative.  ____________________________________________   PHYSICAL EXAM:  VITAL SIGNS: ED Triage Vitals  Enc Vitals Group     BP 06/29/14 0834 161/83 mmHg     Pulse Rate 06/29/14 0834 79     Resp 06/29/14 0834 18     Temp 06/29/14 0834 98 F (36.7 C)     Temp Source 06/29/14 0834 Oral     SpO2 06/29/14 0834 98 %     Weight 06/29/14 0834 340 lb (154.223 kg)     Height 06/29/14 0834 5\' 5"  (1.651 m)     Head Cir --      Peak Flow --      Pain Score 06/29/14 0835 10     Pain Loc --      Pain  Edu? --      Excl. in [place]? --      Constitutional: Alert and oriented. Well appearing and in no distress. Eyes: Conjunctivae are normal.  ENT   Head: Normocephalic and atraumatic.   Mouth/Throat: Mucous membranes are moist. Cardiovascular: Normal rate, regular rhythm. Normal and symmetric distal pulses are present in all extremities. No murmurs, rubs, or gallops. Respiratory: Normal respiratory effort without tachypnea nor retractions. Breath sounds are clear and equal bilaterally.  Gastrointestinal: Soft and non-tender in all quadrants. No distention. There is no CVA tenderness. Genitourinary: deferred Musculoskeletal: Nontender with normal range of motion in all extremities. No lower  extremity tenderness nor edema. Neurologic:  Normal speech and language. No gross focal neurologic deficits are appreciated. Skin:  Skin is warm, dry and intact. No rash noted. Psychiatric: Mood and affect are normal. Patient exhibits appropriate insight and judgment.  ____________________________________________    LABS (pertinent positives/negatives)  Labs Reviewed  CBC    ____________________________________________   EKG  None  ____________________________________________    RADIOLOGY  None  ____________________________________________   PROCEDURES  Procedure(s) performed: none  Critical Care performed: none  ____________________________________________   INITIAL IMPRESSION / ASSESSMENT AND PLAN / ED COURSE  Pertinent labs & imaging results that were available during my care of the patient were reviewed by me and considered in my medical decision making (see chart for details).  Patient overall well-appearing, normal heart rate, hypertension. She requests a refill of OCPs and I think this is very reasonable. We will first check a CBC to make sure she is not had significant blood loss. We have deferred pelvic exam at the patient's request given that she is having her usual symptoms.   ----------------------------------------- 9:25 AM on 06/29/2014 -----------------------------------------  Hemoglobin stable, vitals normal, patient is okay with plan. We will discharge ____________________________________________   FINAL CLINICAL IMPRESSION(S) / ED DIAGNOSES  Final diagnoses:  DUB (dysfunctional uterine bleeding)     Lavonia Drafts, MD 06/29/14 [phone]

## 2014-06-29 NOTE — ED Notes (Signed)
Pt to ed with c/o vaginal bleeding.  Pt states she has been having large amount of vaginal bleeding with sever pain for 4 months,  Pt has been to ED multiple times for same.  Pt states she is now on last day of BCP to control bleeding and vaginal bleeding started yesterday.  Pt states OBGYN appt is July 18.

## 2014-08-30 ENCOUNTER — Emergency Department
Admission: EM | Admit: 2014-08-30 | Discharge: 2014-08-30 | Disposition: A | Discharge status: Home / Self Care | Payer: Self-pay | Attending: Emergency Medicine | Admitting: Emergency Medicine

## 2014-08-30 ENCOUNTER — Encounter: Payer: Self-pay | Admitting: Emergency Medicine

## 2014-08-30 DIAGNOSIS — N939 Abnormal uterine and vaginal bleeding, unspecified: Secondary | ICD-10-CM | POA: Insufficient documentation

## 2014-08-30 DIAGNOSIS — Z760 Encounter for issue of repeat prescription: Secondary | ICD-10-CM | POA: Insufficient documentation

## 2014-08-30 DIAGNOSIS — Z87891 Personal history of nicotine dependence: Secondary | ICD-10-CM | POA: Insufficient documentation

## 2014-08-30 DIAGNOSIS — Z79899 Other long term (current) drug therapy: Secondary | ICD-10-CM | POA: Insufficient documentation

## 2014-08-30 HISTORY — DX: Excessive and frequent menstruation with irregular cycle: N92.1

## 2014-08-30 MED ORDER — NORGESTIMATE-ETH ESTRADIOL 0.25-35 MG-MCG PO TABS: 1.0000 | ORAL_TABLET | Freq: Every day | ORAL | Status: DC

## 2014-08-30 NOTE — Discharge Instructions (Signed)

## 2014-08-30 NOTE — ED Notes (Signed)
Pt to ER to refill Birth Control pills used to regulate irregular periods. Pt reports spotting for 2 days now, since she ran out of medication. Pt denies any cramping at this time.

## 2014-08-30 NOTE — ED Provider Notes (Signed)
[facility] Emergency Department Provider Note  ____________________________________________  Time seen: Approximately 7:54 PM  I have reviewed the triage vital signs and the nursing notes.   HISTORY  Chief Complaint Medication Refill    HPI Krystal Willis is a 24 y.o. female with a history of dysfunctional uterine bleeding who presents with some light spotting in the setting of running out of her Sprintec.  Her bleeding is very light and she is asymptomatic from it with no lightheadedness or dizziness.  She try to go to [facility] but they could not make an appointment for her for a month.  Her Sprintec was last prescribed in the emergency department but she has run out of her 2 refills.  Her symptoms are mild and gradual in onset.   Past Medical History  Diagnosis Date  . Menometrorrhagia     There are no active problems to display for this patient.   History reviewed. No pertinent past surgical history.  Current Outpatient Rx  Name  Route  Sig  Dispense  Refill  . medroxyPROGESTERone (PROVERA) 10 MG tablet   Oral   Take 1 tablet (10 mg total) by mouth daily.   10 tablet   0   . medroxyPROGESTERone (PROVERA) 10 MG tablet   Oral   Take 1 tablet (10 mg total) by mouth daily.   10 tablet   0   . norgestimate-ethinyl estradiol (ORTHO-CYCLEN,SPRINTEC,PREVIFEM) 0.25-35 MG-MCG tablet   Oral   Take 1 tablet by mouth daily.   1 Package   5   . oxyCODONE-acetaminophen (ROXICET) 5-325 MG per tablet   Oral   Take 1 tablet by mouth every 6 (six) hours as needed.   20 tablet   0   . sulfamethoxazole-trimethoprim (BACTRIM DS) 800-160 MG per tablet   Oral   Take 1 tablet by mouth 2 (two) times daily.   14 tablet   0     Allergies Review of patient's allergies indicates no known allergies.  No family history on file.  Social History Social History  Substance Use Topics  . Smoking status: Former Research scientist (life sciences)  . Smokeless tobacco: None  .  Alcohol Use: No    Review of Systems Constitutional: No fever/chills Eyes: No visual changes. ENT: No sore throat. Cardiovascular: Denies chest pain. Respiratory: Denies shortness of breath. Gastrointestinal: No abdominal pain.  No nausea, no vomiting.  No diarrhea.  No constipation. Genitourinary: Negative for dysuria.  Mild intermittent vaginal spotting Musculoskeletal: Negative for back pain. Skin: Negative for rash. Neurological: Negative for headaches, focal weakness or numbness.  10-point ROS otherwise negative.  ____________________________________________   PHYSICAL EXAM:  VITAL SIGNS: ED Triage Vitals  Enc Vitals Group     BP 08/30/14 1817 156/82 mmHg     Pulse Rate 08/30/14 1817 85     Resp 08/30/14 1817 24     Temp 08/30/14 1817 98.5 F (36.9 C)     Temp Source 08/30/14 1817 Oral     SpO2 08/30/14 1817 99 %     Weight 08/30/14 1817 285 lb (129.275 kg)     Height 08/30/14 1817 5\' 6"  (1.676 m)     Head Cir --      Peak Flow --      Pain Score 08/30/14 1818 0     Pain Loc --      Pain Edu? --      Excl. in [place]? --     Constitutional: Alert and oriented. Well appearing and  in no acute distress. Eyes: Conjunctivae are normal. PERRL. EOMI. Cardiovascular: Normal rate, regular rhythm. Grossly normal heart sounds.  Good peripheral circulation. Respiratory: Normal respiratory effort.  No retractions. Lungs CTAB. Gastrointestinal: Obese, Soft and nontender. No distention.  No CVA tenderness. Musculoskeletal: No lower extremity tenderness nor edema.  No joint effusions. Neurologic:  Normal speech and language. No gross focal neurologic deficits are appreciated.  Skin:  Skin is warm, dry and intact. No rash noted. Psychiatric: Mood and affect are normal. Speech and behavior are normal.  ____________________________________________   LABS (all labs ordered are listed, but only abnormal results are displayed)  Not  indicated ____________________________________________  EKG  Not indicated ____________________________________________  RADIOLOGY  Not indicated ____________________________________________   PROCEDURES  Procedure(s) performed: None  Critical Care performed: No ____________________________________________   INITIAL IMPRESSION / ASSESSMENT AND PLAN / ED COURSE  Pertinent labs & imaging results that were available during my care of the patient were reviewed by me and considered in my medical decision making (see chart for details).  No indication of emergent medical condition.  I refilled her Sprintec with 5 refills.  At her request, I also gave her the name of Dr. Marcelline Mates at [facility] because she does not want to wait to follow-up with [facility], and Dr. Marcelline Mates is on call tonight for unassigned.  ____________________________________________  FINAL CLINICAL IMPRESSION(S) / ED DIAGNOSES  Final diagnoses:  Encounter for medication refill  Vaginal bleeding      NEW MEDICATIONS STARTED DURING THIS VISIT:  New Prescriptions   NORGESTIMATE-ETHINYL ESTRADIOL (ORTHO-CYCLEN,SPRINTEC,PREVIFEM) 0.25-35 MG-MCG TABLET    Take 1 tablet by mouth daily.     Hinda Kehr, MD 08/30/14 2032

## 2014-08-30 NOTE — ED Notes (Signed)
Patient presents to the ED for a refill of Sprintec birth control pills.  Patient states the ED placed her on the pills to assist with irregular periods but patient states she ran out 2 days ago and has been spotting since then.  Patient states she tried to make an appointment with the OB-GYN but they could not see her for 1 month.

## 2015-01-20 ENCOUNTER — Emergency Department
Admission: EM | Admit: 2015-01-20 | Discharge: 2015-01-20 | Disposition: A | Discharge status: Home / Self Care | Payer: BLUE CROSS/BLUE SHIELD | Attending: Emergency Medicine | Admitting: Emergency Medicine

## 2015-01-20 DIAGNOSIS — Z793 Long term (current) use of hormonal contraceptives: Secondary | ICD-10-CM | POA: Diagnosis not present

## 2015-01-20 DIAGNOSIS — Z87891 Personal history of nicotine dependence: Secondary | ICD-10-CM | POA: Insufficient documentation

## 2015-01-20 DIAGNOSIS — M25562 Pain in left knee: Secondary | ICD-10-CM | POA: Diagnosis present

## 2015-01-20 DIAGNOSIS — M25462 Effusion, left knee: Secondary | ICD-10-CM | POA: Diagnosis not present

## 2015-01-20 DIAGNOSIS — Z79899 Other long term (current) drug therapy: Secondary | ICD-10-CM | POA: Insufficient documentation

## 2015-01-20 DIAGNOSIS — M25469 Effusion, unspecified knee: Secondary | ICD-10-CM

## 2015-01-20 MED ORDER — KETOROLAC TROMETHAMINE 60 MG/2ML IM SOLN
60.0000 mg | Freq: Once | INTRAMUSCULAR | Status: AC
  Administered 2015-01-20: 60 mg via INTRAMUSCULAR
  Filled 2015-01-20: qty 2

## 2015-01-20 MED ORDER — HYDROMORPHONE HCL 1 MG/ML IJ SOLN
1.0000 mg | Freq: Once | INTRAMUSCULAR | Status: AC
Start: 2015-01-20 — End: 2015-01-20
  Administered 2015-01-20: 1 mg via INTRAMUSCULAR
  Filled 2015-01-20: qty 1

## 2015-01-20 MED ORDER — OXYCODONE-ACETAMINOPHEN 7.5-325 MG PO TABS: 1.0000 | ORAL_TABLET | ORAL | Status: DC | PRN

## 2015-01-20 NOTE — ED Provider Notes (Signed)
[facility] Emergency Department Provider Note  ____________________________________________  Time seen: Approximately 5:27 PM  I have reviewed the triage vital signs and the nursing notes.   HISTORY  Chief Complaint Knee Pain    HPI Krystal Willis is a 25 y.o. female patient complaining of 3 months of left knee pain and swelling secondary to work related injury. Patient's states dissatisfaction with the treating orthopedic doctor under Worker's Compensation. She states she had a cortisone shot after a MRI 3 days ago.He should notice no relief since taking injection. Patient state her treating orthopedic doctors hesitant on withdrawing fluid from her knee. She stated naproxen has not helped with the pain. Patient rated the pain as a 10 over 10. No other palliative measures for this complaint. The patient narcotic Log unremarkable. Patient's grossly overweight height.   Past Medical History  Diagnosis Date  . Menometrorrhagia     There are no active problems to display for this patient.   No past surgical history on file.  Current Outpatient Rx  Name  Route  Sig  Dispense  Refill  . medroxyPROGESTERone (PROVERA) 10 MG tablet   Oral   Take 1 tablet (10 mg total) by mouth daily.   10 tablet   0   . medroxyPROGESTERone (PROVERA) 10 MG tablet   Oral   Take 1 tablet (10 mg total) by mouth daily.   10 tablet   0   . norgestimate-ethinyl estradiol (ORTHO-CYCLEN,SPRINTEC,PREVIFEM) 0.25-35 MG-MCG tablet   Oral   Take 1 tablet by mouth daily.   1 Package   5   . oxyCODONE-acetaminophen (PERCOCET) 7.5-325 MG tablet   Oral   Take 1 tablet by mouth every 4 (four) hours as needed for severe pain.   20 tablet   0   . oxyCODONE-acetaminophen (ROXICET) 5-325 MG per tablet   Oral   Take 1 tablet by mouth every 6 (six) hours as needed.   20 tablet   0   . sulfamethoxazole-trimethoprim (BACTRIM DS) 800-160 MG per tablet   Oral   Take 1 tablet by mouth  2 (two) times daily.   14 tablet   0     Allergies Review of patient's allergies indicates no known allergies.  No family history on file.  Social History Social History  Substance Use Topics  . Smoking status: Former Research scientist (life sciences)  . Smokeless tobacco: Not on file  . Alcohol Use: No    Review of Systems Constitutional: No fever/chills Eyes: No visual changes. ENT: No sore throat. Cardiovascular: Denies chest pain. Respiratory: Denies shortness of breath. Gastrointestinal: No abdominal pain.  No nausea, no vomiting.  No diarrhea.  No constipation. Genitourinary: Negative for dysuria. Musculoskeletal: Left knee pain  Skin: Negative for rash. Neurological: Negative for headaches, focal weakness or numbness. 10-point ROS otherwise negative.  ____________________________________________   PHYSICAL EXAM:  VITAL SIGNS: ED Triage Vitals  Enc Vitals Group     BP 01/20/15 1707 162/82 mmHg     Pulse Rate 01/20/15 1707 97     Resp 01/20/15 1707 16     Temp 01/20/15 1707 97.4 F (36.3 C)     Temp Source 01/20/15 1707 Oral     SpO2 01/20/15 1707 98 %     Weight 01/20/15 1707 320 lb (145.151 kg)     Height 01/20/15 1707 5\' 5"  (1.651 m)     Head Cir --      Peak Flow --      Pain Score 01/20/15 1709  10     Pain Loc --      Pain Edu? --      Excl. in [place]? --     Constitutional: Alert and oriented. Well appearing and in no acute distress. Morbid obesity Eyes: Conjunctivae are normal. PERRL. EOMI. Head: Atraumatic. Nose: No congestion/rhinnorhea. Mouth/Throat: Mucous membranes are moist.  Oropharynx non-erythematous. Neck: No stridor.  No cervical spine tenderness to palpation. Hematological/Lymphatic/Immunilogical: No cervical lymphadenopathy. Cardiovascular: Normal rate, regular rhythm. Grossly normal heart sounds.  Good peripheral circulation. Respiratory: Normal respiratory effort.  No retractions. Lungs CTAB. Gastrointestinal: Soft and nontender. No distention. No  abdominal bruits. No CVA tenderness. Musculoskeletal: No obvious deformity of the left knee..  Left anterior knee joint effusions. Mild crepitus Neurologic:  Normal speech and language. No gross focal neurologic deficits are appreciated. No gait instability. Skin:  Skin is warm, dry and intact. No rash noted. Psychiatric: Mood and affect are normal. Speech and behavior are normal.  ____________________________________________   LABS (all labs ordered are listed, but only abnormal results are displayed)  Labs Reviewed - No data to display ____________________________________________  EKG   ____________________________________________  RADIOLOGY   ____________________________________________   PROCEDURES  Procedure(s) performed: None  Critical Care performed: No  ____________________________________________   INITIAL IMPRESSION / ASSESSMENT AND PLAN / ED COURSE  Pertinent labs & imaging results that were available during my care of the patient were reviewed by me and considered in my medical decision making (see chart for details).  Internal derangement left knee. Patient advised to rule injections did not immediately work. Advised continued conservative treatment and follow up with treating orthopedic Dr. Patient given a prescription for Percocets for 3 days. ____________________________________________   FINAL CLINICAL IMPRESSION(S) / ED DIAGNOSES  Final diagnoses:  Joint effusion of the lower leg      Sable Feil, PA-C 01/20/15 [place], MD 01/21/15 [phone]

## 2015-01-20 NOTE — ED Notes (Signed)
Pt she is under workers comp due to pain in left knee. Pt states she just received a cortisone shot on Thursday with no relief. She also take Naproxen with no relief.

## 2015-04-11 ENCOUNTER — Encounter (HOSPITAL_COMMUNITY): Payer: Self-pay | Admitting: *Deleted

## 2015-04-11 NOTE — H&P (Signed)
  Krystal Willis is an 25 y.o. female.    Chief Complaint: left knee pain  HPI: Pt is a 25 y.o. female complaining of left knee pain for multiple months. Pain had continually increased since the beginning. X-rays in the clinic show osteochondroma left leg. Pt has tried various conservative treatments which have failed to alleviate their symptoms, including injections and therapy. Various options are discussed with the patient. Risks, benefits and expectations were discussed with the patient. Patient understand the risks, benefits and expectations and wishes to proceed with surgery.   PCP:  No PCP Per Patient  D/C Plans: Home  PMH: Past Medical History  Diagnosis Date  . Menometrorrhagia     PSH: No past surgical history on file.  Social History:  reports that she has quit smoking. She does not have any smokeless tobacco history on file. She reports that she does not drink alcohol or use illicit drugs.  Allergies:  No Known Allergies  Medications: No current facility-administered medications for this encounter.   Current Outpatient Prescriptions  Medication Sig Dispense Refill  . naproxen (NAPROSYN) 500 MG tablet Take 500 mg by mouth 2 (two) times daily as needed for mild pain.    . norgestimate-ethinyl estradiol (ORTHO-CYCLEN,SPRINTEC,PREVIFEM) 0.25-35 MG-MCG tablet Take 1 tablet by mouth daily. 1 Package 5  . medroxyPROGESTERone (PROVERA) 10 MG tablet Take 1 tablet (10 mg total) by mouth daily. (Patient not taking: Reported on 04/11/2015) 10 tablet 0  . medroxyPROGESTERone (PROVERA) 10 MG tablet Take 1 tablet (10 mg total) by mouth daily. (Patient not taking: Reported on 04/11/2015) 10 tablet 0  . oxyCODONE-acetaminophen (PERCOCET) 7.5-325 MG tablet Take 1 tablet by mouth every 4 (four) hours as needed for severe pain. (Patient not taking: Reported on 04/11/2015) 20 tablet 0  . oxyCODONE-acetaminophen (ROXICET) 5-325 MG per tablet Take 1 tablet by mouth every 6 (six) hours as needed.  (Patient not taking: Reported on 04/11/2015) 20 tablet 0  . sulfamethoxazole-trimethoprim (BACTRIM DS) 800-160 MG per tablet Take 1 tablet by mouth 2 (two) times daily. (Patient not taking: Reported on 04/11/2015) 14 tablet 0    No results found for this or any previous visit (from the past 48 hour(s)). No results found.  ROS: Pain with rom of the left lower extremity  Physical Exam:  Alert and oriented 25 y.o. female in no acute distress Cranial nerves 2-12 intact Cervical spine: full rom with no tenderness, nv intact distally Chest: active breath sounds bilaterally, no wheeze rhonchi or rales Heart: regular rate and rhythm, no murmur Abd: non tender non distended with active bowel sounds Hip is stable with rom  Left knee with medial joint line tenderness nv intact distally Antalgic gait Stable exam otherwise  Assessment/Plan Assessment: left knee pain with osteochondroma  Plan: Patient will undergo a left knee scope with open osteochondroma removal by Dr. Veverly Fells at [facility]. Risks benefits and expectations were discussed with the patient. Patient understand risks, benefits and expectations and wishes to proceed.

## 2015-04-11 NOTE — Progress Notes (Signed)
Pt denies SOB, chest pain, and being under the care of a cardiologist. Pt denies having a stress test, echo and cardiac cath. Pt denies having a chest x ray and EKG within the last year. Pt denies having labs done within the last 2 weeks. Pt made aware to stop taking Aspirin, vitamins, fish oil and herbal medications. Do not take any NSAIDs ie: Ibuprofen, Advil, Naproxen, BC and Goody Powder or any medication containing Aspirin. Pt verbalized understanding of all pre-op instructions.

## 2015-04-12 MED ORDER — DEXTROSE 5 % IV SOLN
3.0000 g | INTRAVENOUS | Status: AC
  Administered 2015-04-15: 3 g via INTRAVENOUS
  Filled 2015-04-12: qty 3000

## 2015-04-15 ENCOUNTER — Ambulatory Visit (HOSPITAL_COMMUNITY)
Admission: RE | Admit: 2015-04-15 | Discharge: 2015-04-15 | Disposition: A | Discharge status: Home / Self Care | Payer: Worker's Compensation | Source: Ambulatory Visit | Attending: Orthopedic Surgery | Admitting: Orthopedic Surgery

## 2015-04-15 ENCOUNTER — Encounter (HOSPITAL_COMMUNITY)
Admission: RE | Disposition: A | Discharge status: Home / Self Care | Payer: Self-pay | Source: Ambulatory Visit | Attending: Orthopedic Surgery

## 2015-04-15 ENCOUNTER — Ambulatory Visit (HOSPITAL_COMMUNITY): Payer: Worker's Compensation | Admitting: Anesthesiology

## 2015-04-15 ENCOUNTER — Encounter (HOSPITAL_COMMUNITY): Payer: Self-pay | Admitting: *Deleted

## 2015-04-15 DIAGNOSIS — Z87891 Personal history of nicotine dependence: Secondary | ICD-10-CM | POA: Diagnosis not present

## 2015-04-15 DIAGNOSIS — M2242 Chondromalacia patellae, left knee: Secondary | ICD-10-CM | POA: Insufficient documentation

## 2015-04-15 DIAGNOSIS — D1622 Benign neoplasm of long bones of left lower limb: Secondary | ICD-10-CM | POA: Diagnosis present

## 2015-04-15 HISTORY — PX: KNEE ARTHROSCOPY WITH OSTEOCHONDRAL DEFECT REPAIR: SHX6579

## 2015-04-15 HISTORY — PX: OSTEOCHONDROMA EXCISION: SHX2137

## 2015-04-15 HISTORY — DX: Benign neoplasm of bone and articular cartilage, unspecified: D16.9

## 2015-04-15 LAB — HEMOGLOBIN: HEMOGLOBIN: 10.7 g/dL — AB (ref 12.0–15.0)

## 2015-04-15 LAB — HCG, SERUM, QUALITATIVE: PREG SERUM: NEGATIVE

## 2015-04-15 SURGERY — ARTHROSCOPY, KNEE, WITH OSTEOCHONDRAL DEFECT REPAIR
Anesthesia: General | Site: Knee | Laterality: Left

## 2015-04-15 MED ORDER — PROPOFOL 10 MG/ML IV BOLUS
INTRAVENOUS | Status: DC | PRN
  Administered 2015-04-15: 350 mg via INTRAVENOUS

## 2015-04-15 MED ORDER — LIDOCAINE HCL 4 % MT SOLN
OROMUCOSAL | Status: DC | PRN
  Administered 2015-04-15: 4 mL via TOPICAL

## 2015-04-15 MED ORDER — KETAMINE HCL 100 MG/ML IJ SOLN
INTRAMUSCULAR | Status: AC
  Filled 2015-04-15: qty 1

## 2015-04-15 MED ORDER — OXYCODONE HCL 5 MG/5ML PO SOLN: 5.0000 mg | Freq: Once | ORAL | Status: AC | PRN

## 2015-04-15 MED ORDER — SUGAMMADEX SODIUM 200 MG/2ML IV SOLN
INTRAVENOUS | Status: DC | PRN
  Administered 2015-04-15: 200 mg via INTRAVENOUS

## 2015-04-15 MED ORDER — HYDROMORPHONE HCL 1 MG/ML IJ SOLN
0.2500 mg | INTRAMUSCULAR | Status: DC | PRN
  Administered 2015-04-15: 0.5 mg via INTRAVENOUS

## 2015-04-15 MED ORDER — LACTATED RINGERS IV SOLN
INTRAVENOUS | Status: DC | PRN
  Administered 2015-04-15 (×2): via INTRAVENOUS

## 2015-04-15 MED ORDER — 0.9 % SODIUM CHLORIDE (POUR BTL) OPTIME
TOPICAL | Status: DC | PRN
  Administered 2015-04-15: 1000 mL

## 2015-04-15 MED ORDER — SODIUM CHLORIDE 0.9 % IR SOLN
Status: DC | PRN
  Administered 2015-04-15: 3000 mL

## 2015-04-15 MED ORDER — FENTANYL CITRATE (PF) 100 MCG/2ML IJ SOLN
INTRAMUSCULAR | Status: AC
  Filled 2015-04-15: qty 2

## 2015-04-15 MED ORDER — ACETAMINOPHEN 10 MG/ML IV SOLN
INTRAVENOUS | Status: DC | PRN
  Administered 2015-04-15: 1000 mg via INTRAVENOUS

## 2015-04-15 MED ORDER — ARTIFICIAL TEARS OP OINT
TOPICAL_OINTMENT | OPHTHALMIC | Status: AC
  Filled 2015-04-15: qty 3.5

## 2015-04-15 MED ORDER — FENTANYL CITRATE (PF) 250 MCG/5ML IJ SOLN
INTRAMUSCULAR | Status: AC
  Filled 2015-04-15: qty 5

## 2015-04-15 MED ORDER — KETAMINE HCL 10 MG/ML IJ SOLN
INTRAMUSCULAR | Status: DC | PRN
  Administered 2015-04-15 (×2): 20 mg via INTRAVENOUS
  Administered 2015-04-15: 40 mg via INTRAVENOUS
  Administered 2015-04-15: 20 mg via INTRAVENOUS

## 2015-04-15 MED ORDER — LACTATED RINGERS IV SOLN: INTRAVENOUS | Status: DC

## 2015-04-15 MED ORDER — ONDANSETRON HCL 4 MG/2ML IJ SOLN
INTRAMUSCULAR | Status: DC | PRN
  Administered 2015-04-15: 4 mg via INTRAVENOUS

## 2015-04-15 MED ORDER — SUCCINYLCHOLINE CHLORIDE 20 MG/ML IJ SOLN
INTRAMUSCULAR | Status: DC | PRN
  Administered 2015-04-15: 160 mg via INTRAVENOUS

## 2015-04-15 MED ORDER — OXYCODONE HCL 5 MG PO TABS
ORAL_TABLET | ORAL | Status: AC
  Filled 2015-04-15: qty 1

## 2015-04-15 MED ORDER — METHOCARBAMOL 500 MG PO TABS: 500.0000 mg | ORAL_TABLET | Freq: Three times a day (TID) | ORAL | Status: DC | PRN

## 2015-04-15 MED ORDER — OXYCODONE-ACETAMINOPHEN 5-325 MG PO TABS: 1.0000 | ORAL_TABLET | ORAL | Status: DC | PRN

## 2015-04-15 MED ORDER — ACETAMINOPHEN 10 MG/ML IV SOLN
INTRAVENOUS | Status: AC
  Filled 2015-04-15: qty 100

## 2015-04-15 MED ORDER — MIDAZOLAM HCL 5 MG/5ML IJ SOLN
INTRAMUSCULAR | Status: DC | PRN
  Administered 2015-04-15: 2 mg via INTRAVENOUS

## 2015-04-15 MED ORDER — ONDANSETRON HCL 4 MG/2ML IJ SOLN
INTRAMUSCULAR | Status: AC
  Filled 2015-04-15: qty 2

## 2015-04-15 MED ORDER — KETOROLAC TROMETHAMINE 30 MG/ML IJ SOLN
INTRAMUSCULAR | Status: AC
  Filled 2015-04-15: qty 2

## 2015-04-15 MED ORDER — PROPOFOL 10 MG/ML IV BOLUS
INTRAVENOUS | Status: AC
  Filled 2015-04-15: qty 20

## 2015-04-15 MED ORDER — DEXAMETHASONE SODIUM PHOSPHATE 10 MG/ML IJ SOLN
INTRAMUSCULAR | Status: DC | PRN
  Administered 2015-04-15: 10 mg via INTRAVENOUS

## 2015-04-15 MED ORDER — ROCURONIUM BROMIDE 100 MG/10ML IV SOLN
INTRAVENOUS | Status: DC | PRN
  Administered 2015-04-15: 30 mg via INTRAVENOUS

## 2015-04-15 MED ORDER — LIDOCAINE HCL (CARDIAC) 20 MG/ML IV SOLN
INTRAVENOUS | Status: DC | PRN
  Administered 2015-04-15: 100 mg via INTRAVENOUS

## 2015-04-15 MED ORDER — ONDANSETRON HCL 4 MG/2ML IJ SOLN: 4.0000 mg | Freq: Four times a day (QID) | INTRAMUSCULAR | Status: DC | PRN

## 2015-04-15 MED ORDER — LIDOCAINE HCL (CARDIAC) 20 MG/ML IV SOLN
INTRAVENOUS | Status: AC
  Filled 2015-04-15: qty 10

## 2015-04-15 MED ORDER — MIDAZOLAM HCL 2 MG/2ML IJ SOLN
INTRAMUSCULAR | Status: AC
  Filled 2015-04-15: qty 2

## 2015-04-15 MED ORDER — FENTANYL CITRATE (PF) 100 MCG/2ML IJ SOLN
INTRAMUSCULAR | Status: DC | PRN
  Administered 2015-04-15 (×2): 100 ug via INTRAVENOUS
  Administered 2015-04-15: 50 ug via INTRAVENOUS

## 2015-04-15 MED ORDER — SUGAMMADEX SODIUM 200 MG/2ML IV SOLN
INTRAVENOUS | Status: AC
  Filled 2015-04-15: qty 2

## 2015-04-15 MED ORDER — BUPIVACAINE-EPINEPHRINE (PF) 0.5% -1:200000 IJ SOLN
INTRAMUSCULAR | Status: AC
  Filled 2015-04-15: qty 30

## 2015-04-15 MED ORDER — ARTIFICIAL TEARS OP OINT
TOPICAL_OINTMENT | OPHTHALMIC | Status: DC | PRN
  Administered 2015-04-15: 1 via OPHTHALMIC

## 2015-04-15 MED ORDER — OXYCODONE HCL 5 MG PO TABS
5.0000 mg | ORAL_TABLET | Freq: Once | ORAL | Status: AC | PRN
  Administered 2015-04-15: 5 mg via ORAL

## 2015-04-15 MED ORDER — HYDROMORPHONE HCL 1 MG/ML IJ SOLN
INTRAMUSCULAR | Status: AC
  Filled 2015-04-15: qty 1

## 2015-04-15 MED ORDER — KETOROLAC TROMETHAMINE 60 MG/2ML IM SOLN
INTRAMUSCULAR | Status: DC | PRN
  Administered 2015-04-15: 60 mg via INTRAMUSCULAR

## 2015-04-15 MED ORDER — CHLORHEXIDINE GLUCONATE 4 % EX LIQD: 60.0000 mL | Freq: Once | CUTANEOUS | Status: DC

## 2015-04-15 SURGICAL SUPPLY — 44 items
BLADE CUTTER GATOR 3.5 (BLADE) ×2 IMPLANT
BNDG COHESIVE 6X5 TAN STRL LF (GAUZE/BANDAGES/DRESSINGS) ×2 IMPLANT
BNDG ELASTIC 6X10 VLCR STRL LF (GAUZE/BANDAGES/DRESSINGS) ×2 IMPLANT
BNDG GAUZE ELAST 4 BULKY (GAUZE/BANDAGES/DRESSINGS) ×2 IMPLANT
DECANTER SPIKE VIAL GLASS SM (MISCELLANEOUS) ×2 IMPLANT
DRAPE ARTHROSCOPY W/POUCH 114 (DRAPES) ×2 IMPLANT
DRSG ADAPTIC 3X8 NADH LF (GAUZE/BANDAGES/DRESSINGS) ×2 IMPLANT
DRSG PAD ABDOMINAL 8X10 ST (GAUZE/BANDAGES/DRESSINGS) ×2 IMPLANT
DURAPREP 26ML APPLICATOR (WOUND CARE) ×2 IMPLANT
ELECT BLADE 4.0 EZ CLEAN MEGAD (MISCELLANEOUS) ×2
ELECT MENISCUS 165MM 90D (ELECTRODE) IMPLANT
ELECT REM PT RETURN 9FT ADLT (ELECTROSURGICAL) ×2
ELECTRODE BLDE 4.0 EZ CLN MEGD (MISCELLANEOUS) ×1 IMPLANT
ELECTRODE REM PT RTRN 9FT ADLT (ELECTROSURGICAL) ×1 IMPLANT
GAUZE SPONGE 4X4 12PLY STRL (GAUZE/BANDAGES/DRESSINGS) ×2 IMPLANT
GAUZE SPONGE 4X4 16PLY XRAY LF (GAUZE/BANDAGES/DRESSINGS) ×2 IMPLANT
GLOVE BIOGEL PI ORTHO PRO SZ8 (GLOVE) ×1
GLOVE PI ORTHO PRO STRL SZ8 (GLOVE) ×1 IMPLANT
GLOVE SURG ORTHO 8.5 STRL (GLOVE) ×2 IMPLANT
GOWN STRL REUS W/ TWL XL LVL3 (GOWN DISPOSABLE) ×2 IMPLANT
GOWN STRL REUS W/TWL XL LVL3 (GOWN DISPOSABLE) ×2
KIT BASIN OR (CUSTOM PROCEDURE TRAY) ×2 IMPLANT
KIT ROOM TURNOVER OR (KITS) ×2 IMPLANT
MANIFOLD NEPTUNE II (INSTRUMENTS) ×2 IMPLANT
PACK ARTHROSCOPY DSU (CUSTOM PROCEDURE TRAY) ×2 IMPLANT
PAD ARMBOARD 7.5X6 YLW CONV (MISCELLANEOUS) ×4 IMPLANT
PADDING CAST ABS 6INX4YD NS (CAST SUPPLIES) ×1
PADDING CAST ABS COTTON 6X4 NS (CAST SUPPLIES) ×1 IMPLANT
PENCIL BUTTON HOLSTER BLD 10FT (ELECTRODE) ×2 IMPLANT
SET ARTHROSCOPY TUBING (MISCELLANEOUS) ×1
SET ARTHROSCOPY TUBING LN (MISCELLANEOUS) ×1 IMPLANT
SPONGE LAP 18X18 X RAY DECT (DISPOSABLE) ×2 IMPLANT
STRIP CLOSURE SKIN 1/2X4 (GAUZE/BANDAGES/DRESSINGS) ×2 IMPLANT
SUT MNCRL AB 4-0 PS2 18 (SUTURE) ×2 IMPLANT
SUT VIC AB 0 CT1 27 (SUTURE) ×1
SUT VIC AB 0 CT1 27XBRD ANBCTR (SUTURE) ×1 IMPLANT
SUT VIC AB 2-0 CT1 27 (SUTURE) ×1
SUT VIC AB 2-0 CT1 TAPERPNT 27 (SUTURE) ×1 IMPLANT
SYR BULB IRRIGATION 50ML (SYRINGE) ×2 IMPLANT
TOWEL OR 17X24 6PK STRL BLUE (TOWEL DISPOSABLE) ×4 IMPLANT
TUBE CONNECTING 12X1/4 (SUCTIONS) ×2 IMPLANT
WAND HAND CNTRL MULTIVAC 90 (MISCELLANEOUS) IMPLANT
WATER STERILE IRR 1000ML POUR (IV SOLUTION) ×2 IMPLANT
WRAP KNEE MAXI GEL POST OP (GAUZE/BANDAGES/DRESSINGS) ×2 IMPLANT

## 2015-04-15 NOTE — OR Nursing (Signed)
Discharge teaching done with patient and patients wife. Patient and wife denied any questions regarding discharge instructions and stated that they were taught the exercises to do at home. Pt stated that she had a walker at home as well. Pt family helped pt get dressed and denied any need for help from staff. Pt stated that pain was better after pain medications. Pt was able to drink sprite and tolerated it well. Denied and dizziness, nausea or vomiting.

## 2015-04-15 NOTE — Transfer of Care (Signed)
Immediate Anesthesia Transfer of Care Note  Patient: Krystal Willis  Procedure(s) Performed: Procedure(s): LEFT KNEE ARTHROSCOPY WITH MEDIAL AND PATELLAR CHONDROPLASTY (Left) OPEN OSTEOCHONDROMA EXCISION (Left)  Patient Location: PACU  Anesthesia Type:General  Level of Consciousness: awake, oriented, sedated, patient cooperative and responds to stimulation  Airway & Oxygen Therapy: Patient Spontanous Breathing and Patient connected to face mask oxygen  Post-op Assessment: Report given to RN, Post -op Vital signs reviewed and stable, Patient moving all extremities and Patient moving all extremities X 4  Post vital signs: Reviewed and stable  Last Vitals:  Filed Vitals:   04/15/15 1213  BP: 149/80  Pulse: 83  Temp: 36.7 C  Resp: 20    Complications: No apparent anesthesia complications

## 2015-04-15 NOTE — Interval H&P Note (Signed)
History and Physical Interval Note:  04/15/2015 2:14 PM  Krystal Willis  has presented today for surgery, with the diagnosis of LEFT KNEE PAINFUL OSTEOCHONDROMA, POSSIBLE MENISCUS CARTLAGE TEAR  The various methods of treatment have been discussed with the patient and family. After consideration of risks, benefits and other options for treatment, the patient has consented to  Procedure(s): LEFT KNEE ARTHROSCOPY WITH OPEN OSTEOCHONDROMA REMOVAL (Left) as a surgical intervention .  The patient's history has been reviewed, patient examined, no change in status, stable for surgery.  I have reviewed the patient's chart and labs.  Questions were answered to the patient's satisfaction.     Korrie Hofbauer,STEVEN R

## 2015-04-15 NOTE — Brief Op Note (Signed)
04/15/2015  3:57 PM  PATIENT:  Valentino Hue  25 y.o. female  PRE-OPERATIVE DIAGNOSIS:  LEFT KNEE PAINFUL OSTEOCHONDROMA, POSSIBLE MENISCUS CARTLAGE TEAR  POST-OPERATIVE DIAGNOSIS:  LEFT KNEE CHONDROMALACIA, PAINFUL OSTEOCHONDROMA  PROCEDURE:  Procedure(s): LEFT KNEE ARTHROSCOPY WITH MEDIAL AND PATELLAR CHONDROPLASTY (Left) OPEN OSTEOCHONDROMA EXCISION (Left) (excisional biopsy)  SURGEON:  Surgeon(s) and Role:    * Netta Cedars, MD - Primary  PHYSICIAN ASSISTANT:   ASSISTANTS: Ventura Bruns, PA-C   ANESTHESIA:   general  Plus local  EBL:  Total I/O In: 1000 [I.V.:1000] Out: -   BLOOD ADMINISTERED:none  DRAINS: none   LOCAL MEDICATIONS USED:  MARCAINE     SPECIMEN: osteochondroma left proximal tibia  DISPOSITION OF SPECIMEN:  PATHOLOGY  COUNTS:  YES  TOURNIQUET:  * No tourniquets in log *  DICTATION: .Other Dictation: Dictation Number [id number]  PLAN OF CARE: Discharge to home after PACU  PATIENT DISPOSITION:  PACU - hemodynamically stable.   Delay start of Pharmacological VTE agent (>24hrs) due to surgical blood loss or risk of bleeding: not applicable

## 2015-04-15 NOTE — Op Note (Signed)
NAMEASHELLY, ZINSMEISTER                ACCOUNT NO.:  [id number]  MEDICAL RECORD NO.:  [id number]  LOCATION:  [facility]                         FACILITY:  [facility]  PHYSICIAN:  Doran Heater. Veverly Fells, M.D. DATE OF BIRTH:  [birth year]  DATE OF PROCEDURE:  04/15/2015 DATE OF DISCHARGE:  04/15/2015                              OPERATIVE REPORT   PREOPERATIVE DIAGNOSIS:  Painful left proximal tibia osteochondroma and left knee pain.  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  PROCEDURE PERFORMED:  Left knee arthroscopy with chondroplasty not to bleeding bone, medial compartment and patellofemoral joint and excisional biopsy/removal of proximal tibial osteochondroma, left tibia.  ATTENDING SURGEON:  Doran Heater. Veverly Fells, M.D.  ASSISTANT:  Abbott Pao. Dixon, PA-C, who was scrubbed to the entire procedure and necessary for satisfactory completion of surgery and assistance with positioning as the patient's BMI is greater than 60.  ANESTHESIA:  General anesthesia was used plus local.  ESTIMATED BLOOD LOSS:  Minimal.  FLUID REPLACEMENT:  1000 mL of crystalloid.  COUNTS:  Instrument counts were correct.  COMPLICATIONS:  There were no complications.  ANTIBIOTICS:  Perioperative antibiotics were given.  INDICATIONS:  The patient is a 25 year old female with increasing left knee pain.  The patient has had progressive pain despite conservative management, presents now with painful osteochondroma by MRI scan and possible internal derangement given mechanical symptoms of giving way. We discussed options for management recommending knee arthroscopy to rule out any internal derangement of the knee as potential source for giving way and also open excisional biopsy of osteochondroma.  Risks and benefits of the surgery were discussed, informed consent obtained.  The patient's BMI is greater than  60.  DESCRIPTION OF PROCEDURE:  After an adequate level of anesthesia achieved, the patient was positioned supine on the operating room table. Lateral post was utilized, sterile prep and drape of the left leg performed.  Time-out was called.  Knee arthroscopy performed through standard arthroscopic portals.  The PA, Darol Destine was required during the entire procedure for adequate positioning of the patient given the BMI of greater than 60 and holding the leg and positioning appropriately for adequate visualization.  We identified the patellofemoral chondromalacia, grade 3, no grade 4 changes noted.  Loose flaps and fibrillation were encountered and we performed a gentle tangential chondroplasty on unstable articular cartilage smoothing down transition points.  The medial and lateral gutters were inspected and free of loose bodies.  Overall patellar tracking looked to be reasonable.  Deeper in the trochlea, the cartilage looked normal.  There was little wear on the medial flare.  We entered the medial compartment, there was no sign of meniscal tear.  We visualized the meniscus from the anterior to the posterior horns.  It was completely stable.  There was some weightbearing articular cartilage, chondromalacia of grade 3. Loose flaps and fibrillation removed using tangential chondroplasty technique.  All attempts were made to preserve articular cartilage that was stable.  ACL was intact.  PCL intact.  Lateral compartment pristine. Following completion, we inspected the meniscal roots laterally as well. We would be satisfied ourselves,  there were no significant loose cartilage segments in the knee or unstable meniscal tears.  We went ahead and concluded the arthroscopic portion of the procedure and then made a longitudinal skin incision over the proximal medial tibia, dissection was down through the subcutaneous tissues.  Deep retractors were utilized.  Two additional sets of hands  were needed.  Charletta Cousin Dixon, [place], again was scrubbed to the entire procedure as well as scrub nurse with deep hips retractors to adequately visualized this osteochondroma, which was a pedunculated osteochondroma, proximal medial tibia.  We identified and cleared off the soft tissue around it. Cartilage cap appeared not excessively large.  We used a small drill bit, 1.6-mm drill bit around the base and then used osteotomes to remove the osteochondroma in its entirety.  Small bone rasp was utilized to smooth the edges.  We applied bone wax to the osteochondroma base to make sure this did not regrow.  We then went ahead and irrigated thoroughly and then closed with layers, and 0 Vicryl, 2-0 Vicryl subcu layered closure followed by staples for skin as well as portals. Sterile dressing was applied.  Gentle pressure dressing applied starting to the toes and working up over the knee.  The patient was transported to the recovery room in stable condition.     Doran Heater. Veverly Fells, M.D.     SRN/MEDQ  D:  04/15/2015  T:  04/15/2015  Job:  [id number]

## 2015-04-15 NOTE — Anesthesia Procedure Notes (Signed)
Procedure Name: Intubation Date/Time: 04/15/2015 2:50 PM Performed by: Jacquiline Doe A Pre-anesthesia Checklist: Patient identified, Timeout performed, Emergency Drugs available, Suction available and Patient being monitored Patient Re-evaluated:Patient Re-evaluated prior to inductionOxygen Delivery Method: Circle system utilized Preoxygenation: Pre-oxygenation with 100% oxygen Intubation Type: IV induction and Cricoid Pressure applied Ventilation: Mask ventilation without difficulty Laryngoscope Size: Mac, 4 and Glidescope Grade View: Grade I Tube type: Oral Tube size: 7.5 mm Number of attempts: 1 Airway Equipment and Method: Rigid stylet,  Video-laryngoscopy and LTA kit utilized Placement Confirmation: ETT inserted through vocal cords under direct vision,  breath sounds checked- equal and bilateral and positive ETCO2 Secured at: 23 cm Tube secured with: Tape Dental Injury: Teeth and Oropharynx as per pre-operative assessment

## 2015-04-15 NOTE — Anesthesia Postprocedure Evaluation (Signed)
Anesthesia Post Note  Patient: Krystal Willis  Procedure(s) Performed: Procedure(s) (LRB): LEFT KNEE ARTHROSCOPY WITH MEDIAL AND PATELLAR CHONDROPLASTY (Left) OPEN OSTEOCHONDROMA EXCISION (Left)  Patient location during evaluation: PACU Anesthesia Type: General Level of consciousness: awake and alert Pain management: pain level controlled Vital Signs Assessment: post-procedure vital signs reviewed and stable Respiratory status: patient remains intubated per anesthesia plan and spontaneous breathing Cardiovascular status: stable Postop Assessment: no signs of nausea or vomiting Anesthetic complications: no    Last Vitals:  Filed Vitals:   04/15/15 1645 04/15/15 1715  BP:  163/91  Pulse:  96  Temp:    Resp: 20     Last Pain:  Filed Vitals:   04/15/15 1727  PainSc: 10-Worst pain ever                 Tiajuana Amass

## 2015-04-15 NOTE — Anesthesia Preprocedure Evaluation (Addendum)
Anesthesia Evaluation  Patient identified by MRN, date of birth, ID band Patient awake    Reviewed: Allergy & Precautions, NPO status , Patient's Chart, lab work & pertinent test results  Airway Mallampati: II  TM Distance: >3 FB Neck ROM: full    Dental   Pulmonary former smoker,    breath sounds clear to auscultation       Cardiovascular negative cardio ROS   Rhythm:regular Rate:Normal     Neuro/Psych    GI/Hepatic   Endo/Other  Morbid obesity  Renal/GU      Musculoskeletal   Abdominal   Peds  Hematology   Anesthesia Other Findings   Reproductive/Obstetrics                            Lab Results  Component Value Date   WBC 12.1* 06/29/2014   HGB 8.4* 06/29/2014   HCT 25.6* 06/29/2014   MCV 75.9* 06/29/2014   PLT 420 06/29/2014   Lab Results  Component Value Date   CREATININE 0.63 05/09/2014   BUN 7 05/09/2014   NA 136 05/09/2014   K 3.8 05/09/2014   CL 105 05/09/2014   CO2 25 05/09/2014    Anesthesia Physical Anesthesia Plan  ASA: III  Anesthesia Plan: General   Post-op Pain Management:    Induction: Intravenous  Airway Management Planned: Oral ETT  Additional Equipment:   Intra-op Plan:   Post-operative Plan:   Informed Consent: I have reviewed the patients History and Physical, chart, labs and discussed the procedure including the risks, benefits and alternatives for the proposed anesthesia with the patient or authorized representative who has indicated his/her understanding and acceptance.     Plan Discussed with: CRNA, Anesthesiologist and Surgeon  Anesthesia Plan Comments:        Anesthesia Quick Evaluation

## 2015-04-15 NOTE — Discharge Instructions (Signed)
Ice to the knee and leg as much as you can.  Remain mobile and active with the walker to take some pressure of the left leg.  This prevents blood clots.  Do exercises constantly - heel slides, calf pumps, straight leg raises, do these often.  Keep the dressing in place if you can until Friday, then ok to get it wet in the shower.  If you can and are not allergic, take a full 325 mg aspirin by mouth with food each day for  One month to prevent blood clots.  If you have the sudden onset of chest pain or shortness of breath or a cough that produces blood, call 911, this could be a sign of a blood clot  Follow up with Dr Veverly Fells in two weeks in the office  [phone]

## 2015-04-16 ENCOUNTER — Encounter (HOSPITAL_COMMUNITY): Payer: Self-pay | Admitting: Orthopedic Surgery

## 2015-04-18 ENCOUNTER — Encounter: Payer: Self-pay | Admitting: Emergency Medicine

## 2015-04-18 ENCOUNTER — Emergency Department: Payer: Worker's Compensation

## 2015-04-18 ENCOUNTER — Emergency Department
Admission: EM | Admit: 2015-04-18 | Discharge: 2015-04-19 | Disposition: A | Discharge status: Home / Self Care | Payer: Worker's Compensation | Attending: Emergency Medicine | Admitting: Emergency Medicine

## 2015-04-18 DIAGNOSIS — Z79899 Other long term (current) drug therapy: Secondary | ICD-10-CM | POA: Diagnosis not present

## 2015-04-18 DIAGNOSIS — Z791 Long term (current) use of non-steroidal anti-inflammatories (NSAID): Secondary | ICD-10-CM | POA: Diagnosis not present

## 2015-04-18 DIAGNOSIS — Z87891 Personal history of nicotine dependence: Secondary | ICD-10-CM | POA: Insufficient documentation

## 2015-04-18 DIAGNOSIS — R079 Chest pain, unspecified: Secondary | ICD-10-CM | POA: Insufficient documentation

## 2015-04-18 DIAGNOSIS — R0602 Shortness of breath: Secondary | ICD-10-CM | POA: Diagnosis present

## 2015-04-18 DIAGNOSIS — R0600 Dyspnea, unspecified: Secondary | ICD-10-CM

## 2015-04-18 DIAGNOSIS — D169 Benign neoplasm of bone and articular cartilage, unspecified: Secondary | ICD-10-CM | POA: Diagnosis not present

## 2015-04-18 NOTE — ED Notes (Signed)
Pt arrived from home with c/o of SOB and left sided chest pain that started around 1300 today. Pt had knee surgery on Monday. Pt is currently taking oxycodone and robaxin.

## 2015-04-18 NOTE — ED Provider Notes (Signed)
[facility] Emergency Department Provider Note  ____________________________________________  Time seen: 11:00 PM  I have reviewed the triage vital signs and the nursing notes.   HISTORY  Chief Complaint Shortness of Breath and Chest Pain      HPI Krystal Willis is a 25 y.o. female with history of recent left knee surgery performed on 04/15/2015 presents to the emergency department with left-sided chest pain accompanied by dyspnea with onset at 1 PM today. Patient states that pain is worse with deep inspiration. Patient denies any fever no cough. Patient denies any anticoagulation during her last surgery to her knowledge. Patient currently taking Percocet and Robaxin for pain at home. Current pain score 6 out of 10.     Past Medical History  Diagnosis Date  . Menometrorrhagia   . Osteochondroma     Left knee    There are no active problems to display for this patient.   Past Surgical History  Procedure Laterality Date  . Knee arthroscopy with osteochondral defect repair Left 04/15/2015    Procedure: LEFT KNEE ARTHROSCOPY WITH MEDIAL AND PATELLAR CHONDROPLASTY;  Surgeon: Netta Cedars, MD;  Location: [place];  Service: Orthopedics;  Laterality: Left;  . Osteochondroma excision Left 04/15/2015    Procedure: OPEN OSTEOCHONDROMA EXCISION;  Surgeon: Netta Cedars, MD;  Location: [place];  Service: Orthopedics;  Laterality: Left;    Current Outpatient Rx  Name  Route  Sig  Dispense  Refill  . medroxyPROGESTERone (PROVERA) 10 MG tablet   Oral   Take 1 tablet (10 mg total) by mouth daily. Patient not taking: Reported on 04/11/2015   10 tablet   0   . medroxyPROGESTERone (PROVERA) 10 MG tablet   Oral   Take 1 tablet (10 mg total) by mouth daily. Patient not taking: Reported on 04/11/2015   10 tablet   0   . methocarbamol (ROBAXIN) 500 MG tablet   Oral   Take 1 tablet (500 mg total) by mouth 3 (three) times daily as needed.   60 tablet   1   . naproxen  (NAPROSYN) 500 MG tablet   Oral   Take 500 mg by mouth 2 (two) times daily as needed for mild pain.         . norgestimate-ethinyl estradiol (ORTHO-CYCLEN,SPRINTEC,PREVIFEM) 0.25-35 MG-MCG tablet   Oral   Take 1 tablet by mouth daily.   1 Package   5   . oxyCODONE-acetaminophen (ROXICET) 5-325 MG per tablet   Oral   Take 1 tablet by mouth every 6 (six) hours as needed. Patient not taking: Reported on 04/11/2015   20 tablet   0   . oxyCODONE-acetaminophen (ROXICET) 5-325 MG tablet   Oral   Take 1-2 tablets by mouth every 4 (four) hours as needed for severe pain.   60 tablet   0   . sulfamethoxazole-trimethoprim (BACTRIM DS) 800-160 MG per tablet   Oral   Take 1 tablet by mouth 2 (two) times daily. Patient not taking: Reported on 04/11/2015   14 tablet   0     Allergies No known drug allergies History reviewed. No pertinent family history.  Social History Social History  Substance Use Topics  . Smoking status: Former Smoker    Types: Cigarettes    Quit date: 01/05/2014  . Smokeless tobacco: Never Used  . Alcohol Use: Yes     Comment: occasional     Review of Systems  Constitutional: Negative for fever. Eyes: Negative for visual changes. ENT: Negative  for sore throat. Cardiovascular: Positive for chest pain. Respiratory: Positive for shortness of breath. Gastrointestinal: Negative for abdominal pain, vomiting and diarrhea. Genitourinary: Negative for dysuria. Musculoskeletal: Negative for back pain. Skin: Negative for rash. Neurological: Negative for headaches, focal weakness or numbness.  10-point ROS otherwise negative.  ____________________________________________   PHYSICAL EXAM:  VITAL SIGNS: ED Triage Vitals  Enc Vitals Group     BP 04/18/15 2212 141/85 mmHg     Pulse Rate 04/18/15 2212 85     Resp 04/18/15 2212 15     Temp 04/18/15 2212 98.2 F (36.8 C)     Temp Source 04/18/15 2212 Oral     SpO2 04/18/15 2212 98 %     Weight 04/18/15  2212 385 lb (174.635 kg)     Height 04/18/15 2212 5\' 5"  (1.651 m)     Head Cir --      Peak Flow --      Pain Score 04/18/15 2213 7     Pain Loc --      Pain Edu? --      Excl. in [place]? --      Constitutional: Alert and oriented. Well appearing and in no distress. Eyes: Conjunctivae are normal. PERRL. Normal extraocular movements. ENT   Head: Normocephalic and atraumatic.   Nose: No congestion/rhinnorhea.   Mouth/Throat: Mucous membranes are moist.   Neck: No stridor. Hematological/Lymphatic/Immunilogical: No cervical lymphadenopathy. Cardiovascular: Normal rate, regular rhythm. Normal and symmetric distal pulses are present in all extremities. No murmurs, rubs, or gallops. Respiratory: Normal respiratory effort without tachypnea nor retractions. Breath sounds are clear and equal bilaterally. No wheezes/rales/rhonchi. Gastrointestinal: Soft and nontender. No distention. There is no CVA tenderness. Genitourinary: deferred Musculoskeletal: Nontender with normal range of motion in all extremities. No joint effusions.  No lower extremity tenderness nor edema. Neurologic:  Normal speech and language. No gross focal neurologic deficits are appreciated. Speech is normal.  Skin:  Skin is warm, dry and intact. No rash noted. Psychiatric: Mood and affect are normal. Speech and behavior are normal. Patient exhibits appropriate insight and judgment.  ____________________________________________    LABS (pertinent positives/negatives)  Labs Reviewed  CBC WITH DIFFERENTIAL/PLATELET - Abnormal; Notable for the following:    WBC 17.5 (*)    Hemoglobin 10.7 (*)    HCT 34.4 (*)    MCV 73.5 (*)    MCH 22.8 (*)    MCHC 31.0 (*)    RDW 18.6 (*)    Neutro Abs 12.1 (*)    Lymphs Abs 4.1 (*)    All other components within normal limits  BASIC METABOLIC PANEL - Abnormal; Notable for the following:    Glucose, Bld 130 (*)    Calcium 8.3 (*)    All other components within normal limits   FIBRIN DERIVATIVES D-DIMER ([facility] ONLY) - Abnormal; Notable for the following:    Fibrin derivatives D-dimer ([facility]) 695 (*)    All other components within normal limits  TROPONIN I     ____________________________________________   EKG  ED ECG REPORT I, [place] BROWN, the attending physician, personally viewed and interpreted this ECG.   Date: 04/19/2015  EKG Time: 10:15 PM  Rate: 99  Rhythm: Normal sinus rhythm  Axis: Normal  Intervals: Normal  ST&T Change: None   ____________________________________________    RADIOLOGY      DG Chest 2 View (Final result) Result time: 04/18/15 [id number]   Final result by Rad Results In Interface (04/18/15 23:36:22)   Narrative:   CLINICAL  DATA: Shortness of breath and left-sided chest pain for 1 day  EXAM: CHEST 2 VIEW  COMPARISON: Chest radiograph and chest CT May 22, 2011  FINDINGS: Lungs are clear. Heart is upper normal in size with pulmonary vascularity within normal limits. No adenopathy. No pneumothorax. No bone lesions.  IMPRESSION: No edema or consolidation.   Electronically Signed By: Lowella Grip III M.D. On: 04/18/2015 23:36          NM Pulmonary Perf and Vent (Final result) Result time: 04/19/15 07:05:23   Final result by Rad Results In Interface (04/19/15 07:05:23)   Narrative:   CLINICAL DATA: Chest pain for 2 days. Recent left knee surgery.  EXAM: NUCLEAR MEDICINE VENTILATION - PERFUSION LUNG SCAN  TECHNIQUE: Ventilation images were obtained in multiple projections using inhaled aerosol Tc-30m DTPA. Perfusion images were obtained in multiple projections after intravenous injection of Tc-75m MAA.  RADIOPHARMACEUTICALS: 43.6 mCi Technetium-47m DTPA aerosol inhalation and 5.891 mCi Technetium-74m MAA IV  COMPARISON: Chest x-ray from yesterday  FINDINGS: A matched ventilation and perfusion defect in the right lower lobe which is moderate to large. No triple match.  Large matched nonsegmental defect on LAO projection attributed to soft tissue attenuation.  IMPRESSION: Low probability for pulmonary embolism by PIOPED II (10 -19%).   Electronically Signed By: Monte Fantasia M.D. On: 04/19/2015 07:05          INITIAL IMPRESSION / ASSESSMENT AND PLAN / ED COURSE  Pertinent labs & imaging results that were available during my care of the patient were reviewed by me and considered in my medical decision making (see chart for details).  Given history of physical exam concern for possible pulmonary emboli as such plan to perform a CT scan PE protocol over the patient exceeds the weight limit of the CAT scan table here at [facility]. As a result of this patient was discussed with [facility] hospitalist on call who stated that they were at capacity and that the only available bed within the [facility] was at [facility]. Given this information VQ scan ordered I spoke with the nuclear medicine technologist who informed me that he would have to order the isotope and that the study would not be able to be performed for 3 hours. I informed the patient of this  _ Patient's VQ scan revealed low probability for pulmonary emboli. Patient states all symptoms resolved no chest pain or shortness of breath at this time. Encouraged patient to follow up with Dr. Veverly Fells orthopedics today.  __________________________________________   FINAL CLINICAL IMPRESSION(S) / ED DIAGNOSES  Final diagnoses:  Dyspnea  Chest pain      Gregor Hams, MD 04/19/15 [phone]

## 2015-04-19 ENCOUNTER — Emergency Department: Payer: Worker's Compensation

## 2015-04-19 LAB — CBC WITH DIFFERENTIAL/PLATELET
BASOS ABS: 0.1 10*3/uL (ref 0–0.1)
BASOS PCT: 1 %
EOS ABS: 0.3 10*3/uL (ref 0–0.7)
Eosinophils Relative: 2 %
HEMATOCRIT: 34.4 % — AB (ref 35.0–47.0)
HEMOGLOBIN: 10.7 g/dL — AB (ref 12.0–16.0)
Lymphocytes Relative: 24 %
Lymphs Abs: 4.1 10*3/uL — ABNORMAL HIGH (ref 1.0–3.6)
MCH: 22.8 pg — ABNORMAL LOW (ref 26.0–34.0)
MCHC: 31 g/dL — ABNORMAL LOW (ref 32.0–36.0)
MCV: 73.5 fL — ABNORMAL LOW (ref 80.0–100.0)
Monocytes Absolute: 0.8 10*3/uL (ref 0.2–0.9)
Monocytes Relative: 5 %
NEUTROS ABS: 12.1 10*3/uL — AB (ref 1.4–6.5)
NEUTROS PCT: 68 %
Platelets: 364 10*3/uL (ref 150–440)
RBC: 4.68 MIL/uL (ref 3.80–5.20)
RDW: 18.6 % — AB (ref 11.5–14.5)
WBC: 17.5 10*3/uL — ABNORMAL HIGH (ref 3.6–11.0)

## 2015-04-19 LAB — BASIC METABOLIC PANEL
Anion gap: 6 (ref 5–15)
BUN: 11 mg/dL (ref 6–20)
CO2: 23 mmol/L (ref 22–32)
CREATININE: 0.56 mg/dL (ref 0.44–1.00)
Calcium: 8.3 mg/dL — ABNORMAL LOW (ref 8.9–10.3)
Chloride: 106 mmol/L (ref 101–111)
GFR calc Af Amer: >60 mL/min (ref >60)
Glucose, Bld: 130 mg/dL — ABNORMAL HIGH (ref 65–99)
POTASSIUM: 3.5 mmol/L (ref 3.5–5.1)
Sodium: 135 mmol/L (ref 135–145)

## 2015-04-19 LAB — FIBRIN DERIVATIVES D-DIMER (ARMC ONLY): FIBRIN DERIVATIVES D-DIMER (ARMC): 695 — AB (ref 0–499)

## 2015-04-19 LAB — TROPONIN I: Troponin I: <0.03 ng/mL (ref <0.031)

## 2015-04-19 MED ORDER — MORPHINE SULFATE (PF) 2 MG/ML IV SOLN
2.0000 mg | Freq: Once | INTRAVENOUS | Status: AC
  Administered 2015-04-19: 2 mg via INTRAVENOUS
  Filled 2015-04-19: qty 1

## 2015-04-19 MED ORDER — TECHNETIUM TO 99M ALBUMIN AGGREGATED
5.8900 | Freq: Once | INTRAVENOUS | Status: AC | PRN
  Administered 2015-04-19: 5.891 via INTRAVENOUS

## 2015-04-19 MED ORDER — TECHNETIUM TC 99M DIETHYLENETRIAME-PENTAACETIC ACID
43.6000 | Freq: Once | INTRAVENOUS | Status: AC | PRN
  Administered 2015-04-19: 43.6 via INTRAVENOUS

## 2015-04-19 MED ORDER — LORAZEPAM 2 MG/ML IJ SOLN
INTRAMUSCULAR | Status: AC
  Administered 2015-04-19: 1 mg via INTRAVENOUS
  Filled 2015-04-19: qty 1

## 2015-04-19 MED ORDER — ONDANSETRON HCL 4 MG/2ML IJ SOLN
4.0000 mg | Freq: Once | INTRAMUSCULAR | Status: AC
  Administered 2015-04-19: 4 mg via INTRAVENOUS
  Filled 2015-04-19: qty 2

## 2015-04-19 MED ORDER — LORAZEPAM 2 MG/ML IJ SOLN
1.0000 mg | Freq: Once | INTRAMUSCULAR | Status: AC
  Administered 2015-04-19: 1 mg via INTRAVENOUS

## 2015-04-19 NOTE — ED Notes (Signed)
Lab called to send phlebotomy tech to come draw troponin, spoke with Denyse Amass, states will send.

## 2015-04-19 NOTE — ED Notes (Signed)
Pt transported NM. 

## 2015-04-19 NOTE — ED Notes (Addendum)
A female called, states she wants detail health information regarding the pt. When informed that health information are not share over the phone due to privacy law, she states "F... You and F... The law, I want you tell me right now." She was informed that this phone call will be terminated and if she would like, she can call her daughter.

## 2015-04-19 NOTE — Discharge Instructions (Signed)
Nonspecific Chest Pain  Chest pain can be caused by many different conditions. There is always a chance that your pain could be related to something serious, such as a heart attack or a blood clot in your lungs. Chest pain can also be caused by conditions that are not life-threatening. If you have chest pain, it is very important to follow up with your health care provider. CAUSES  Chest pain can be caused by:  Heartburn.  Pneumonia or bronchitis.  Anxiety or stress.  Inflammation around your heart (pericarditis) or lung (pleuritis or pleurisy).  A blood clot in your lung.  A collapsed lung (pneumothorax). It can develop suddenly on its own (spontaneous pneumothorax) or from trauma to the chest.  Shingles infection (varicella-zoster virus).  Heart attack.  Damage to the bones, muscles, and cartilage that make up your chest wall. This can include:  Bruised bones due to injury.  Strained muscles or cartilage due to frequent or repeated coughing or overwork.  Fracture to one or more ribs.  Sore cartilage due to inflammation (costochondritis). RISK FACTORS  Risk factors for chest pain may include:  Activities that increase your risk for trauma or injury to your chest.  Respiratory infections or conditions that cause frequent coughing.  Medical conditions or overeating that can cause heartburn.  Heart disease or family history of heart disease.  Conditions or health behaviors that increase your risk of developing a blood clot.  Having had chicken pox (varicella zoster). SIGNS AND SYMPTOMS Chest pain can feel like:  Burning or tingling on the surface of your chest or deep in your chest.  Crushing, pressure, aching, or squeezing pain.  Dull or sharp pain that is worse when you move, cough, or take a deep breath.  Pain that is also felt in your back, neck, shoulder, or arm, or pain that spreads to any of these areas. Your chest pain may come and go, or it may stay  constant. DIAGNOSIS Lab tests or other studies may be needed to find the cause of your pain. Your health care provider may have you take a test called an ambulatory ECG (electrocardiogram). An ECG records your heartbeat patterns at the time the test is performed. You may also have other tests, such as:  Transthoracic echocardiogram (TTE). During echocardiography, sound waves are used to create a picture of all of the heart structures and to look at how blood flows through your heart.  Transesophageal echocardiogram (TEE).This is a more advanced imaging test that obtains images from inside your body. It allows your health care provider to see your heart in finer detail.  Cardiac monitoring. This allows your health care provider to monitor your heart rate and rhythm in real time.  Holter monitor. This is a portable device that records your heartbeat and can help to diagnose abnormal heartbeats. It allows your health care provider to track your heart activity for several days, if needed.  Stress tests. These can be done through exercise or by taking medicine that makes your heart beat more quickly.  Blood tests.  Imaging tests. TREATMENT  Your treatment depends on what is causing your chest pain. Treatment may include:  Medicines. These may include:  Acid blockers for heartburn.  Anti-inflammatory medicine.  Pain medicine for inflammatory conditions.  Antibiotic medicine, if an infection is present.  Medicines to dissolve blood clots.  Medicines to treat coronary artery disease.  Supportive care for conditions that do not require medicines. This may include:  Resting.  Applying heat  or cold packs to injured areas.  Limiting activities until pain decreases. HOME CARE INSTRUCTIONS  If you were prescribed an antibiotic medicine, finish it all even if you start to feel better.  Avoid any activities that bring on chest pain.  Do not use any tobacco products, including  cigarettes, chewing tobacco, or electronic cigarettes. If you need help quitting, ask your health care provider.  Do not drink alcohol.  Take medicines only as directed by your health care provider.  Keep all follow-up visits as directed by your health care provider. This is important. This includes any further testing if your chest pain does not go away.  If heartburn is the cause for your chest pain, you may be told to keep your head raised (elevated) while sleeping. This reduces the chance that acid will go from your stomach into your esophagus.  Make lifestyle changes as directed by your health care provider. These may include:  Getting regular exercise. Ask your health care provider to suggest some activities that are safe for you.  Eating a heart-healthy diet. A registered dietitian can help you to learn healthy eating options.  Maintaining a healthy weight.  Managing diabetes, if necessary.  Reducing stress. SEEK MEDICAL CARE IF:  Your chest pain does not go away after treatment.  You have a rash with blisters on your chest.  You have a fever. SEEK IMMEDIATE MEDICAL CARE IF:   Your chest pain is worse.  You have an increasing cough, or you cough up blood.  You have severe abdominal pain.  You have severe weakness.  You faint.  You have chills.  You have sudden, unexplained chest discomfort.  You have sudden, unexplained discomfort in your arms, back, neck, or jaw.  You have shortness of breath at any time.  You suddenly start to sweat, or your skin gets clammy.  You feel nauseous or you vomit.  You suddenly feel light-headed or dizzy.  Your heart begins to beat quickly, or it feels like it is skipping beats. These symptoms may represent a serious problem that is an emergency. Do not wait to see if the symptoms will go away. Get medical help right away. Call your local emergency services (911 in the [place] Do not drive yourself to the hospital.   This  information is not intended to replace advice given to you by your health care provider. Make sure you discuss any questions you have with your health care provider.   Document Released: 10/01/2004 Document Revised: 01/12/2014 Document Reviewed: 07/28/2013 Elsevier Interactive Patient Education 2016 [employer].  Shortness of Breath Shortness of breath means you have trouble breathing. It could also mean that you have a medical problem. You should get immediate medical care for shortness of breath. CAUSES   Not enough oxygen in the air such as with high altitudes or a smoke-filled room.  Certain lung diseases, infections, or problems.  Heart disease or conditions, such as angina or heart failure.  Low red blood cells (anemia).  Poor physical fitness, which can cause shortness of breath when you exercise.  Chest or back injuries or stiffness.  Being overweight.  Smoking.  Anxiety, which can make you feel like you are not getting enough air. DIAGNOSIS  Serious medical problems can often be found during your physical exam. Tests may also be done to determine why you are having shortness of breath. Tests may include:  Chest X-rays.  Lung function tests.  Blood tests.  An electrocardiogram (ECG).  An ambulatory  electrocardiogram. An ambulatory ECG records your heartbeat patterns over a 24-hour period.  Exercise testing.  A transthoracic echocardiogram (TTE). During echocardiography, sound waves are used to evaluate how blood flows through your heart.  A transesophageal echocardiogram (TEE).  Imaging scans. Your health care provider may not be able to find a cause for your shortness of breath after your exam. In this case, it is important to have a follow-up exam with your health care provider as directed.  TREATMENT  Treatment for shortness of breath depends on the cause of your symptoms and can vary greatly. HOME CARE INSTRUCTIONS   Do not smoke. Smoking is a common  cause of shortness of breath. If you smoke, ask for help to quit.  Avoid being around chemicals or things that may bother your breathing, such as paint fumes and dust.  Rest as needed. Slowly resume your usual activities.  If medicines were prescribed, take them as directed for the full length of time directed. This includes oxygen and any inhaled medicines.  Keep all follow-up appointments as directed by your health care provider. SEEK MEDICAL CARE IF:   Your condition does not improve in the time expected.  You have a hard time doing your normal activities even with rest.  You have any new symptoms. SEEK IMMEDIATE MEDICAL CARE IF:   Your shortness of breath gets worse.  You feel light-headed, faint, or develop a cough not controlled with medicines.  You start coughing up blood.  You have pain with breathing.  You have chest pain or pain in your arms, shoulders, or abdomen.  You have a fever.  You are unable to walk up stairs or exercise the way you normally do. MAKE SURE YOU:  Understand these instructions.  Will watch your condition.  Will get help right away if you are not doing well or get worse.   This information is not intended to replace advice given to you by your health care provider. Make sure you discuss any questions you have with your health care provider.   Document Released: 09/16/2000 Document Revised: 12/27/2012 Document Reviewed: 03/09/2011 Elsevier Interactive Patient Education [employer].

## 2015-04-19 NOTE — ED Notes (Signed)
AAOx3.  Skin warm and dry.  No SOB/ DOE.  NAD 

## 2016-02-09 ENCOUNTER — Emergency Department: Payer: Self-pay

## 2016-02-09 ENCOUNTER — Observation Stay
Admission: EM | Admit: 2016-02-09 | Discharge: 2016-02-10 | Disposition: A | Discharge status: Home / Self Care | Payer: Self-pay | Attending: Internal Medicine | Admitting: Internal Medicine

## 2016-02-09 DIAGNOSIS — A419 Sepsis, unspecified organism: Principal | ICD-10-CM | POA: Insufficient documentation

## 2016-02-09 DIAGNOSIS — N921 Excessive and frequent menstruation with irregular cycle: Secondary | ICD-10-CM | POA: Insufficient documentation

## 2016-02-09 DIAGNOSIS — R197 Diarrhea, unspecified: Secondary | ICD-10-CM

## 2016-02-09 DIAGNOSIS — D72829 Elevated white blood cell count, unspecified: Secondary | ICD-10-CM | POA: Insufficient documentation

## 2016-02-09 DIAGNOSIS — A0811 Acute gastroenteropathy due to Norwalk agent: Secondary | ICD-10-CM | POA: Insufficient documentation

## 2016-02-09 DIAGNOSIS — R509 Fever, unspecified: Secondary | ICD-10-CM

## 2016-02-09 DIAGNOSIS — K760 Fatty (change of) liver, not elsewhere classified: Secondary | ICD-10-CM | POA: Insufficient documentation

## 2016-02-09 DIAGNOSIS — R112 Nausea with vomiting, unspecified: Secondary | ICD-10-CM

## 2016-02-09 DIAGNOSIS — Z87891 Personal history of nicotine dependence: Secondary | ICD-10-CM | POA: Insufficient documentation

## 2016-02-09 DIAGNOSIS — R1110 Vomiting, unspecified: Secondary | ICD-10-CM | POA: Diagnosis present

## 2016-02-09 DIAGNOSIS — R000 Tachycardia, unspecified: Secondary | ICD-10-CM | POA: Insufficient documentation

## 2016-02-09 LAB — CBC WITH DIFFERENTIAL/PLATELET
BASOS ABS: 0 10*3/uL (ref 0–0.1)
Basophils Relative: 0 %
EOS ABS: 0 10*3/uL (ref 0–0.7)
EOS PCT: 0 %
HCT: 40.8 % (ref 35.0–47.0)
Hemoglobin: 13.1 g/dL (ref 12.0–16.0)
Lymphocytes Relative: 5 %
Lymphs Abs: 1.1 10*3/uL (ref 1.0–3.6)
MCH: 25.2 pg — AB (ref 26.0–34.0)
MCHC: 32 g/dL (ref 32.0–36.0)
MCV: 78.7 fL — ABNORMAL LOW (ref 80.0–100.0)
Monocytes Absolute: 0.6 10*3/uL (ref 0.2–0.9)
Monocytes Relative: 3 %
Neutro Abs: 18.9 10*3/uL — ABNORMAL HIGH (ref 1.4–6.5)
Neutrophils Relative %: 92 %
PLATELETS: 338 10*3/uL (ref 150–440)
RBC: 5.18 MIL/uL (ref 3.80–5.20)
RDW: 15.8 % — AB (ref 11.5–14.5)
WBC: 20.6 10*3/uL — ABNORMAL HIGH (ref 3.6–11.0)

## 2016-02-09 LAB — URINALYSIS, ROUTINE W REFLEX MICROSCOPIC
BACTERIA UA: NONE SEEN
Bilirubin Urine: NEGATIVE
Glucose, UA: NEGATIVE mg/dL
Hgb urine dipstick: NEGATIVE
Ketones, ur: 5 mg/dL — AB
Leukocytes, UA: NEGATIVE
Nitrite: NEGATIVE
PROTEIN: 30 mg/dL — AB
Specific Gravity, Urine: 1.026 (ref 1.005–1.030)
pH: 5 (ref 5.0–8.0)

## 2016-02-09 LAB — COMPREHENSIVE METABOLIC PANEL
ALT: 32 U/L (ref 14–54)
AST: 54 U/L — AB (ref 15–41)
Albumin: 3.8 g/dL (ref 3.5–5.0)
Alkaline Phosphatase: 89 U/L (ref 38–126)
Anion gap: 10 (ref 5–15)
BUN: 9 mg/dL (ref 6–20)
CHLORIDE: 103 mmol/L (ref 101–111)
CO2: 21 mmol/L — ABNORMAL LOW (ref 22–32)
CREATININE: 0.71 mg/dL (ref 0.44–1.00)
Calcium: 8.9 mg/dL (ref 8.9–10.3)
GFR calc non Af Amer: >60 mL/min (ref >60)
Glucose, Bld: 128 mg/dL — ABNORMAL HIGH (ref 65–99)
POTASSIUM: 4.2 mmol/L (ref 3.5–5.1)
SODIUM: 134 mmol/L — AB (ref 135–145)
Total Bilirubin: 1.3 mg/dL — ABNORMAL HIGH (ref 0.3–1.2)
Total Protein: 8 g/dL (ref 6.5–8.1)

## 2016-02-09 LAB — LACTIC ACID, PLASMA: LACTIC ACID, VENOUS: 1.5 mmol/L (ref 0.5–1.9)

## 2016-02-09 LAB — PREGNANCY, URINE: Preg Test, Ur: NEGATIVE

## 2016-02-09 LAB — INFLUENZA PANEL BY PCR (TYPE A & B)
Influenza A By PCR: NEGATIVE
Influenza B By PCR: NEGATIVE

## 2016-02-09 MED ORDER — ACETAMINOPHEN 500 MG PO TABS
1000.0000 mg | ORAL_TABLET | Freq: Once | ORAL | Status: AC
  Administered 2016-02-09: 1000 mg via ORAL

## 2016-02-09 MED ORDER — KETOROLAC TROMETHAMINE 30 MG/ML IJ SOLN
30.0000 mg | Freq: Once | INTRAMUSCULAR | Status: AC
  Administered 2016-02-09: 30 mg via INTRAVENOUS

## 2016-02-09 MED ORDER — ONDANSETRON HCL 4 MG/2ML IJ SOLN
4.0000 mg | Freq: Once | INTRAMUSCULAR | Status: AC
Start: 2016-02-09 — End: 2016-02-09
  Administered 2016-02-09: 4 mg via INTRAVENOUS

## 2016-02-09 MED ORDER — SODIUM CHLORIDE 0.9 % IV BOLUS (SEPSIS)
1000.0000 mL | Freq: Once | INTRAVENOUS | Status: AC
  Administered 2016-02-09: 1000 mL via INTRAVENOUS

## 2016-02-09 MED ORDER — ONDANSETRON HCL 4 MG/2ML IJ SOLN
INTRAMUSCULAR | Status: AC
  Administered 2016-02-09: 4 mg via INTRAVENOUS
  Filled 2016-02-09: qty 2

## 2016-02-09 MED ORDER — ONDANSETRON HCL 4 MG/2ML IJ SOLN
4.0000 mg | Freq: Once | INTRAMUSCULAR | Status: AC
  Administered 2016-02-09: 4 mg via INTRAVENOUS

## 2016-02-09 MED ORDER — ONDANSETRON HCL 4 MG/2ML IJ SOLN
INTRAMUSCULAR | Status: AC
  Filled 2016-02-09: qty 2

## 2016-02-09 MED ORDER — KETOROLAC TROMETHAMINE 30 MG/ML IJ SOLN
INTRAMUSCULAR | Status: AC
  Administered 2016-02-09: 30 mg via INTRAVENOUS
  Filled 2016-02-09: qty 1

## 2016-02-09 MED ORDER — IBUPROFEN 400 MG PO TABS
600.0000 mg | ORAL_TABLET | Freq: Once | ORAL | Status: AC
Start: 2016-02-09 — End: 2016-02-09
  Administered 2016-02-09: 600 mg via ORAL
  Filled 2016-02-09: qty 2

## 2016-02-09 MED ORDER — ACETAMINOPHEN 500 MG PO TABS
ORAL_TABLET | ORAL | Status: AC
  Filled 2016-02-09: qty 2

## 2016-02-09 NOTE — ED Provider Notes (Signed)
[facility] Emergency Department Provider Note  Time seen: 6:29 PM  I have reviewed the triage vital signs and the nursing notes.   HISTORY  Chief Complaint Fever; Chills; and Emesis    HPI Krystal Willis is a 26 y.o. female who presents to the emergency department with body aches fever nausea vomiting and diarrhea. According to the patient since last night she has been vomiting with diarrhea, spiking subjective fevers today with chills and diffuse body aches. Denies cough. Denies abdominal pain. Patient states she was recently exposed to a family member who is diagnosed with influenza with similar symptoms.  Past Medical History:  Diagnosis Date  . Menometrorrhagia   . Osteochondroma    Left knee    There are no active problems to display for this patient.   Past Surgical History:  Procedure Laterality Date  . KNEE ARTHROSCOPY WITH OSTEOCHONDRAL DEFECT REPAIR Left 04/15/2015   Procedure: LEFT KNEE ARTHROSCOPY WITH MEDIAL AND PATELLAR CHONDROPLASTY;  Surgeon: Netta Cedars, MD;  Location: [place];  Service: Orthopedics;  Laterality: Left;  . OSTEOCHONDROMA EXCISION Left 04/15/2015   Procedure: OPEN OSTEOCHONDROMA EXCISION;  Surgeon: Netta Cedars, MD;  Location: [place];  Service: Orthopedics;  Laterality: Left;    Prior to Admission medications   Medication Sig Start Date End Date Taking? Authorizing Provider  medroxyPROGESTERone (PROVERA) 10 MG tablet Take 1 tablet (10 mg total) by mouth daily. Patient not taking: Reported on 04/11/2015 05/09/14 05/09/15  Earleen Newport, MD  medroxyPROGESTERone (PROVERA) 10 MG tablet Take 1 tablet (10 mg total) by mouth daily. Patient not taking: Reported on 04/11/2015 05/31/14   Carrie Mew, MD  methocarbamol (ROBAXIN) 500 MG tablet Take 1 tablet (500 mg total) by mouth 3 (three) times daily as needed. 04/15/15   Netta Cedars, MD  naproxen (NAPROSYN) 500 MG tablet Take 500 mg by mouth 2 (two) times daily as needed for mild  pain.    Historical Provider, MD  norgestimate-ethinyl estradiol (ORTHO-CYCLEN,SPRINTEC,PREVIFEM) 0.25-35 MG-MCG tablet Take 1 tablet by mouth daily. 08/30/14   Hinda Kehr, MD  oxyCODONE-acetaminophen (ROXICET) 5-325 MG per tablet Take 1 tablet by mouth every 6 (six) hours as needed. Patient not taking: Reported on 04/11/2015 06/11/14   Earleen Newport, MD  oxyCODONE-acetaminophen (ROXICET) 5-325 MG tablet Take 1-2 tablets by mouth every 4 (four) hours as needed for severe pain. 04/15/15   Netta Cedars, MD  sulfamethoxazole-trimethoprim (BACTRIM DS) 800-160 MG per tablet Take 1 tablet by mouth 2 (two) times daily. Patient not taking: Reported on 04/11/2015 05/31/14   Carrie Mew, MD    No Known Allergies  No family history on file.  Social History Social History  Substance Use Topics  . Smoking status: Former Smoker    Types: Cigarettes    Quit date: 01/05/2014  . Smokeless tobacco: Never Used  . Alcohol use Yes     Comment: occasional     Review of Systems Constitutional: Positive for fever. Positive for body aches. Cardiovascular: Negative for chest pain. Respiratory: Negative for shortness of breath. Negative for cough. Gastrointestinal: Negative for abdominal pain. Positive for vomiting and diarrhea. Genitourinary: Negative for dysuria. Neurological: Mild headache. Denies focal weakness or numbness. 10-point ROS otherwise negative.  ____________________________________________   PHYSICAL EXAM:  VITAL SIGNS: ED Triage Vitals [02/09/16 1749]  Enc Vitals Group     BP 138/85     Pulse Rate (!) 150     Resp 20     Temp 99.9 F (37.7 C)  Temp Source Oral     SpO2 100 %     Weight (!) 394 lb (178.7 kg)     Height 5\' 5"  (1.651 m)     Head Circumference      Peak Flow      Pain Score      Pain Loc      Pain Edu?      Excl. in [place]?     Constitutional: Alert and oriented. Well appearing and in no distress. Eyes: Normal exam ENT   Head: Normocephalic and  atraumatic.   Mouth/Throat: Mucous membranes are moist. Cardiovascular: Regular rhythm and rate around 150 bpm. No murmur. Respiratory: Normal respiratory effort without tachypnea nor retractions. Breath sounds are clear. No wheezes rales or rhonchi. Gastrointestinal: Soft and nontender. No distention.  Obese. Musculoskeletal: Nontender with normal range of motion in all extremities. Neurologic:  Normal speech and language. No gross focal neurologic deficits  Skin:  Skin is warm, dry and intact.  Psychiatric: Mood and affect are normal.  ____________________________________________    EKG  EKG reviewed and interpreted by myself shows sinus tachycardia 149 bpm, narrow QRS, normal axis, normal intervals and nonspecific but no concerning ST changes.  ____________________________________________    RADIOLOGY  Chest x-ray negative  ____________________________________________   INITIAL IMPRESSION / ASSESSMENT AND PLAN / ED COURSE  Pertinent labs & imaging results that were available during my care of the patient were reviewed by me and considered in my medical decision making (see chart for details).  Patient presents the emergency department with fever body aches vomiting, diarrhea and recent flu exposure. Overall the patient appears well, no distress, clear lung sounds, nontender abdomen. We will check labs, swab for influenza, treat with IV fluids Toradol and Tylenol or close monitoring in the emergency department, to ensure reduction of heart rate and improvement in symptoms.  Patient's labs are resulted negative for influenza. Patient does have white blood cell count of 20,000, continues to be tachycardic at 130 bpm with continued fever of 100.5. We will send blood cultures, lactic acid. Continue to await urinalysis. We will IV hydrate. Difficulty obtaining IV, I placed an ultrasound guided IV after her first IV infiltrated.  Patient's urinalysis is negative. Patient's lactic  acid is 1.5. Patient continues to be tachycardic after 2 L of fluid currently 130 bpm. Patient has not been able to provide a stool sample yet in the emergency department. Remains nauseated. Denies any abdominal pain, no abdominal tenderness. The patient does have significant leukocytosis of 20,000. Given the patient's ongoing nausea, generalized fatigue fever tachycardia with significant leukocytosis and no explanation with the patient's symptoms at this time I will discuss the patient with the hospitalist for admission to the hospital for further workup. Patient is agreeable to this plan.  ____________________________________________   FINAL CLINICAL IMPRESSION(S) / ED DIAGNOSES   Nausea vomiting diarrhea Fever Tachycardia   Harvest Dark, MD 02/09/16 2244

## 2016-02-09 NOTE — ED Notes (Signed)
Received report from [employer].

## 2016-02-09 NOTE — ED Triage Notes (Signed)
Pt presents to ED c/o flu like symptoms: fever, chills, vomiting x2 +. Pt has not taken anything for symptoms.

## 2016-02-09 NOTE — ED Notes (Signed)
Pt hooked up to cardic monitoring  Lm edt

## 2016-02-09 NOTE — ED Notes (Signed)
Attempted PIV x 2, one to left AC, one to left upper arm.  Attempts unsuccessful.

## 2016-02-09 NOTE — ED Notes (Addendum)
IV attempt in triage x1 unsuccessful by this nurse. Spoke with [facility] nurse. Gave pt ice chips in the meantime

## 2016-02-10 ENCOUNTER — Observation Stay: Payer: Self-pay

## 2016-02-10 LAB — GASTROINTESTINAL PANEL BY PCR, STOOL (REPLACES STOOL CULTURE)
ADENOVIRUS F40/41: NOT DETECTED
ASTROVIRUS: NOT DETECTED
CAMPYLOBACTER SPECIES: NOT DETECTED
CRYPTOSPORIDIUM: NOT DETECTED
Cyclospora cayetanensis: NOT DETECTED
ENTEROAGGREGATIVE E COLI (EAEC): NOT DETECTED
ENTEROPATHOGENIC E COLI (EPEC): NOT DETECTED
ENTEROTOXIGENIC E COLI (ETEC): NOT DETECTED
Entamoeba histolytica: NOT DETECTED
Giardia lamblia: NOT DETECTED
NOROVIRUS GI/GII: DETECTED — AB
PLESIMONAS SHIGELLOIDES: NOT DETECTED
ROTAVIRUS A: NOT DETECTED
Salmonella species: NOT DETECTED
Sapovirus (I, II, IV, and V): NOT DETECTED
Shiga like toxin producing E coli (STEC): NOT DETECTED
Shigella/Enteroinvasive E coli (EIEC): NOT DETECTED
Vibrio cholerae: NOT DETECTED
Vibrio species: NOT DETECTED
YERSINIA ENTEROCOLITICA: NOT DETECTED

## 2016-02-10 LAB — CBC
HEMATOCRIT: 34.6 % — AB (ref 35.0–47.0)
HEMOGLOBIN: 11.3 g/dL — AB (ref 12.0–16.0)
MCH: 25.5 pg — ABNORMAL LOW (ref 26.0–34.0)
MCHC: 32.6 g/dL (ref 32.0–36.0)
MCV: 78.4 fL — ABNORMAL LOW (ref 80.0–100.0)
Platelets: 296 10*3/uL (ref 150–440)
RBC: 4.42 MIL/uL (ref 3.80–5.20)
RDW: 15.6 % — ABNORMAL HIGH (ref 11.5–14.5)
WBC: 11.3 10*3/uL — ABNORMAL HIGH (ref 3.6–11.0)

## 2016-02-10 LAB — BASIC METABOLIC PANEL
ANION GAP: 8 (ref 5–15)
BUN: 11 mg/dL (ref 6–20)
CO2: 21 mmol/L — ABNORMAL LOW (ref 22–32)
Calcium: 7.6 mg/dL — ABNORMAL LOW (ref 8.9–10.3)
Chloride: 107 mmol/L (ref 101–111)
Creatinine, Ser: 0.7 mg/dL (ref 0.44–1.00)
GLUCOSE: 131 mg/dL — AB (ref 65–99)
POTASSIUM: 3.6 mmol/L (ref 3.5–5.1)
Sodium: 136 mmol/L (ref 135–145)

## 2016-02-10 LAB — LACTIC ACID, PLASMA: Lactic Acid, Venous: 1.3 mmol/L (ref 0.5–1.9)

## 2016-02-10 LAB — LACTOFERRIN, FECAL, QUALITATIVE: LACTOFERRIN, FECAL, QUAL: POSITIVE — AB

## 2016-02-10 MED ORDER — RISAQUAD PO CAPS
2.0000 | ORAL_CAPSULE | Freq: Every day | ORAL | Status: DC
  Administered 2016-02-10: 2 via ORAL
  Filled 2016-02-10: qty 2

## 2016-02-10 MED ORDER — SODIUM CHLORIDE 0.9 % IV SOLN
INTRAVENOUS | Status: DC
  Administered 2016-02-10: 10:00:00 via INTRAVENOUS

## 2016-02-10 MED ORDER — NORGESTIMATE-ETH ESTRADIOL 0.25-35 MG-MCG PO TABS: 1.0000 | ORAL_TABLET | Freq: Every day | ORAL | 0 refills | Status: DC

## 2016-02-10 MED ORDER — IPRATROPIUM BROMIDE 0.02 % IN SOLN: 0.5000 mg | Freq: Four times a day (QID) | RESPIRATORY_TRACT | Status: DC | PRN

## 2016-02-10 MED ORDER — SODIUM CHLORIDE 0.9% FLUSH
3.0000 mL | Freq: Two times a day (BID) | INTRAVENOUS | Status: DC
  Administered 2016-02-10: 10:00:00 3 mL via INTRAVENOUS

## 2016-02-10 MED ORDER — PIPERACILLIN-TAZOBACTAM 4.5 G IVPB
4.5000 g | Freq: Three times a day (TID) | INTRAVENOUS | Status: DC
  Administered 2016-02-10 (×2): 4.5 g via INTRAVENOUS
  Filled 2016-02-10 (×4): qty 100

## 2016-02-10 MED ORDER — ALBUTEROL SULFATE (2.5 MG/3ML) 0.083% IN NEBU: 2.5000 mg | INHALATION_SOLUTION | Freq: Four times a day (QID) | RESPIRATORY_TRACT | Status: DC | PRN

## 2016-02-10 MED ORDER — ONDANSETRON HCL 4 MG PO TABS: 4.0000 mg | ORAL_TABLET | Freq: Four times a day (QID) | ORAL | Status: DC | PRN

## 2016-02-10 MED ORDER — MORPHINE SULFATE (PF) 2 MG/ML IV SOLN: 1.0000 mg | INTRAVENOUS | Status: DC | PRN

## 2016-02-10 MED ORDER — NORGESTIMATE-ETH ESTRADIOL 0.25-35 MG-MCG PO TABS: 1.0000 | ORAL_TABLET | Freq: Every day | ORAL | Status: DC

## 2016-02-10 MED ORDER — MAGNESIUM CITRATE PO SOLN: 1.0000 | Freq: Once | ORAL | Status: DC | PRN

## 2016-02-10 MED ORDER — ACETAMINOPHEN 325 MG PO TABS: 650.0000 mg | ORAL_TABLET | Freq: Four times a day (QID) | ORAL | Status: DC | PRN

## 2016-02-10 MED ORDER — BISACODYL 5 MG PO TBEC: 5.0000 mg | DELAYED_RELEASE_TABLET | Freq: Every day | ORAL | Status: DC | PRN

## 2016-02-10 MED ORDER — ONDANSETRON HCL 4 MG/2ML IJ SOLN: 4.0000 mg | Freq: Four times a day (QID) | INTRAMUSCULAR | Status: DC | PRN

## 2016-02-10 MED ORDER — OXYCODONE HCL 5 MG PO TABS: 5.0000 mg | ORAL_TABLET | ORAL | Status: DC | PRN

## 2016-02-10 MED ORDER — INFLUENZA VAC SPLIT QUAD 0.5 ML IM SUSY: 0.5000 mL | PREFILLED_SYRINGE | INTRAMUSCULAR | Status: DC

## 2016-02-10 MED ORDER — VANCOMYCIN HCL 10 G IV SOLR
1250.0000 mg | Freq: Once | INTRAVENOUS | Status: AC
  Administered 2016-02-10: 02:00:00 1250 mg via INTRAVENOUS
  Filled 2016-02-10: qty 1250

## 2016-02-10 MED ORDER — VANCOMYCIN HCL 10 G IV SOLR
1250.0000 mg | Freq: Three times a day (TID) | INTRAVENOUS | Status: DC
  Administered 2016-02-10: 1250 mg via INTRAVENOUS
  Filled 2016-02-10 (×3): qty 1250

## 2016-02-10 MED ORDER — ACETAMINOPHEN 650 MG RE SUPP: 650.0000 mg | Freq: Four times a day (QID) | RECTAL | Status: DC | PRN

## 2016-02-10 MED ORDER — SENNOSIDES-DOCUSATE SODIUM 8.6-50 MG PO TABS: 1.0000 | ORAL_TABLET | Freq: Every evening | ORAL | Status: DC | PRN

## 2016-02-10 NOTE — Progress Notes (Signed)
Pharmacy Antibiotic Note  Krystal Willis is a 26 y.o. female admitted on 02/09/2016 with sepsis.  Pharmacy has been consulted for Zosyn and vancomycin dosing.  Plan: 1. Zosyn 4.5 gm IV Q8H EI due to BMI greater than 40 (weight confirmed with RN) 2. Vancomycin 1.25 gm IV x 1 followed in 6 hours (stacked dosing) by vancomycin 1.25 gm IV Q8H, predicted trough lower than target but initial maximum dose. Pharmacy will continue to follow and adjust as needed to maintain trough 15 to 20 mcg/mL.  Vd 73.8 L, Ke 0.153 hr-1, T1/2 4.5 hr  Height: 5\' 5"  (165.1 cm) Weight: (!) 394 lb (178.7 kg) IBW/kg (Calculated) : 57  Temp (24hrs), Avg:100.1 F (37.8 C), Min:98.8 F (37.1 C), Max:101.3 F (38.5 C)   Recent Labs Lab 02/09/16 1845 02/09/16 2105  WBC 20.6*  --   CREATININE 0.71  --   LATICACIDVEN  --  1.5    Estimated Creatinine Clearance: 179.4 mL/min (by C-G formula based on SCr of 0.71 mg/dL).    No Known Allergies  Thank you for allowing pharmacy to be a part of this patient's care.  Laural Benes, Pharm.D., BCPS Clinical Pharmacist 02/10/2016 1:24 AM

## 2016-02-10 NOTE — Discharge Summary (Signed)
[facility] [place] NAME: Krystal Willis    MR#:  [id number]  DATE OF BIRTH:  January 08, 1990  DATE OF ADMISSION:  02/09/2016 ADMITTING PHYSICIAN: Fritzi Mandes, MD  DATE OF DISCHARGE: 02/10/16  PRIMARY CARE PHYSICIAN: No PCP Per Patient    ADMISSION DIAGNOSIS:  Tachycardia [R00.0] Fever, unspecified fever cause [R50.9] Diarrhea, unspecified type [R19.7]  DISCHARGE DIAGNOSIS:  Viral gastroenteritis-Norovirus Leucocytosis-improved  SECONDARY DIAGNOSIS:   Past Medical History:  Diagnosis Date  . Menometrorrhagia   . Osteochondroma    Left knee    HOSPITAL COURSE:   26 y.o. female with no significant past medical history now being admitted with:  1. Sepsis, likely secondary to viral gastroenteritis -GI panel positive for Norvovirus -symptoms improving -tolerating po CLD---will advance to [facility] -recieved Aggressive IV fluid hydrationBC neg -d/c abxs---no bacterial infection -pt overall feels a lot better  2. DVT prophylaxis Lovenox  D/c home if tolerates FLD. Pt agreeable and requesting to go home as well CONSULTS OBTAINED:    DRUG ALLERGIES:  No Known Allergies  DISCHARGE MEDICATIONS:   Current Discharge Medication List    CONTINUE these medications which have CHANGED   Details  norgestimate-ethinyl estradiol (ORTHO-CYCLEN,SPRINTEC,PREVIFEM) 0.25-35 MG-MCG tablet Take 1 tablet by mouth daily. Qty: 1 Package, Refills: 0        If you experience worsening of your admission symptoms, develop shortness of breath, life threatening emergency, suicidal or homicidal thoughts you must seek medical attention immediately by calling 911 or calling your MD immediately  if symptoms less severe.  You Must read complete instructions/literature along with all the possible adverse reactions/side effects for all the Medicines you take and that have been prescribed to you. Take any new Medicines after you have completely understood and  accept all the possible adverse reactions/side effects.   Please note  You were cared for by a hospitalist during your hospital stay. If you have any questions about your discharge medications or the care you received while you were in the hospital after you are discharged, you can call the unit and asked to speak with the hospitalist on call if the hospitalist that took care of you is not available. Once you are discharged, your primary care physician will handle any further medical issues. Please note that NO REFILLS for any discharge medications will be authorized once you are discharged, as it is imperative that you return to your primary care physician (or establish a relationship with a primary care physician if you do not have one) for your aftercare needs so that they can reassess your need for medications and monitor your lab values. Today   SUBJECTIVE  I feel better   VITAL SIGNS:  Blood pressure 123/65, pulse (!) 102, temperature 97.5 F (36.4 C), temperature source Oral, resp. rate 20, height 5\' 5"  (1.651 m), weight (!) 178.7 kg (394 lb), SpO2 99 %.  I/O:   Intake/Output Summary (Last 24 hours) at 02/10/16 1131 Last data filed at 02/10/16 0318  Gross per 24 hour  Intake             2200 ml  Output                0 ml  Net             2200 ml    PHYSICAL EXAMINATION:  GENERAL:  26 y.o.-year-old patient lying in the bed with no acute distress. obese EYES: Pupils equal, round, reactive to light  and accommodation. No scleral icterus. Extraocular muscles intact.  HEENT: Head atraumatic, normocephalic. Oropharynx and nasopharynx clear.  NECK:  Supple, no jugular venous distention. No thyroid enlargement, no tenderness.  LUNGS: Normal breath sounds bilaterally, no wheezing, rales,rhonchi or crepitation. No use of accessory muscles of respiration.  CARDIOVASCULAR: S1, S2 normal. No murmurs, rubs, or gallops.  ABDOMEN: Soft, non-tender, non-distended. Bowel sounds present. No  organomegaly or mass.  EXTREMITIES: No pedal edema, cyanosis, or clubbing.  NEUROLOGIC: Cranial nerves II through XII are intact. Muscle strength 5/5 in all extremities. Sensation intact. Gait not checked.  PSYCHIATRIC: The patient is alert and oriented x 3.  SKIN: No obvious rash, lesion, or ulcer.   DATA REVIEW:   CBC   Recent Labs Lab 02/10/16 0231  WBC 11.3*  HGB 11.3*  HCT 34.6*  PLT 296    Chemistries   Recent Labs Lab 02/09/16 1845 02/10/16 0231  NA 134* 136  K 4.2 3.6  CL 103 107  CO2 21* 21*  GLUCOSE 128* 131*  BUN 9 11  CREATININE 0.71 0.70  CALCIUM 8.9 7.6*  AST 54*  --   ALT 32  --   ALKPHOS 89  --   BILITOT 1.3*  --     Microbiology Results   Recent Results (from the past 240 hour(s))  Blood culture (routine x 2)     Status: None (Preliminary result)   Collection Time: 02/09/16  9:05 PM  Result Value Ref Range Status   Specimen Description BLOOD LEFT ANTECUBITAL  Final   Special Requests   Final    BOTTLES DRAWN AEROBIC AND ANAEROBIC AER5CC, ANA 6CC   Culture NO GROWTH < 12 HOURS  Final   Report Status PENDING  Incomplete  Blood culture (routine x 2)     Status: None (Preliminary result)   Collection Time: 02/09/16 11:53 PM  Result Value Ref Range Status   Specimen Description BLOOD LEFT ANTECUBITAL  Final   Special Requests   Final    BOTTLES DRAWN AEROBIC AND ANAEROBIC AER 8CC, ANA 8CC   Culture NO GROWTH < 12 HOURS  Final   Report Status PENDING  Incomplete  Gastrointestinal Panel by PCR , Stool     Status: Abnormal   Collection Time: 02/10/16  5:14 AM  Result Value Ref Range Status   Campylobacter species NOT DETECTED NOT DETECTED Final   Plesimonas shigelloides NOT DETECTED NOT DETECTED Final   Salmonella species NOT DETECTED NOT DETECTED Final   Yersinia enterocolitica NOT DETECTED NOT DETECTED Final   Vibrio species NOT DETECTED NOT DETECTED Final   Vibrio cholerae NOT DETECTED NOT DETECTED Final   Enteroaggregative E coli (EAEC)  NOT DETECTED NOT DETECTED Final   Enteropathogenic E coli (EPEC) NOT DETECTED NOT DETECTED Final   Enterotoxigenic E coli (ETEC) NOT DETECTED NOT DETECTED Final   Shiga like toxin producing E coli (STEC) NOT DETECTED NOT DETECTED Final   Shigella/Enteroinvasive E coli (EIEC) NOT DETECTED NOT DETECTED Final   Cryptosporidium NOT DETECTED NOT DETECTED Final   Cyclospora cayetanensis NOT DETECTED NOT DETECTED Final   Entamoeba histolytica NOT DETECTED NOT DETECTED Final   Giardia lamblia NOT DETECTED NOT DETECTED Final   Adenovirus F40/41 NOT DETECTED NOT DETECTED Final   Astrovirus NOT DETECTED NOT DETECTED Final   Norovirus GI/GII DETECTED (A) NOT DETECTED Final    Comment: RESULT CALLED TO, READ BACK BY AND VERIFIED WITH: CHRISTINE [facility] 02/10/16 [id number] SGD    Rotavirus A NOT DETECTED NOT DETECTED Final  Sapovirus (I, II, IV, and V) NOT DETECTED NOT DETECTED Final    RADIOLOGY:  Dg Chest 2 View  Result Date: 02/09/2016 CLINICAL DATA:  Flu-like symptoms EXAM: CHEST  2 VIEW COMPARISON:  04/18/2015 FINDINGS: Normal heart size and mediastinal contours. No acute infiltrate or edema. No effusion or pneumothorax. No acute osseous findings. IMPRESSION: Negative chest. Electronically Signed   By: Monte Fantasia M.D.   On: 02/09/2016 20:31   US Abdomen Complete  Result Date: 02/10/2016 CLINICAL DATA:  26 year old female with intractable vomiting and nausea. Initial encounter. EXAM: ABDOMEN ULTRASOUND COMPLETE COMPARISON:  05/22/2011 chest CT. FINDINGS: Gallbladder: No gallstones or wall thickening visualized. No sonographic Murphy sign noted by sonographer. Common bile duct: Diameter: 4.8 mm Liver: Increased echogenicity consistent with fatty infiltration without obvious mass. Evaluation slightly limited secondary to patient's habitus and poor penetration. IVC: No abnormality visualized. Pancreas: Not well delineated secondary to bowel gas and habitus. Spleen: Size and appearance within normal limits.  Right Kidney: Length: 13.3 cm. Echogenicity within normal limits. No mass or hydronephrosis visualized. Left Kidney: Length: 11.6 cm. Partially obscured by bowel gas and habitus. No obvious hydronephrosis. Abdominal aorta: Poorly delineated secondary to bowel gas and habitus. Other findings: None. IMPRESSION: Fatty liver. No gallstones or acute gallbladder abnormality noted. Secondary to bowel gas and habitus, limited evaluation of pancreas, aorta and portions of the left kidney. Electronically Signed   By: Genia Del M.D.   On: 02/10/2016 09:08     Management plans discussed with the patient, family and they are in agreement.  CODE STATUS:     Code Status Orders        Start     Ordered   02/10/16 0017  Full code  Continuous     02/10/16 0016    Code Status History    Date Active Date Inactive Code Status Order ID Comments User Context   This patient has a current code status but no historical code status.      TOTAL TIME TAKING CARE OF THIS PATIENT: *40 minutes.    Cindra Austad M.D on 02/10/2016 at 11:31 AM  Between 7am to 6pm - Pager - [phone] After 6pm go to [email or web address] - password [email or web address] [facility]  Office  [phone]  CC: Primary care physician; No PCP Per Patient

## 2016-02-10 NOTE — Progress Notes (Signed)
[place], [place].   02/10/2016  Patient: Krystal Willis   Date of Birth:  November 22, 1990  Date of admission:  02/09/2016  Date of Discharge  02/10/2016    To Whom it May Concern:   Legaci Melvin  may return to work on 02/12/16.  PHYSICAL ACTIVITY:  Full  If you have any questions or concerns, please don't hesitate to call.  Sincerely,   Nicholes Mango M.D Pager [phone] Office : [phone]   .

## 2016-02-10 NOTE — Care Management (Signed)
Admitted to [facility] observation status with the diagnosis of intractable vomiting. Lives with wife, Cletus Gash [phone]. No primary care physician or insurance. States insurance will be effective March 1st. 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 Takes care of all basic and instrumental activities of daily living herself. Wife will transport.  Shelbie Ammons RN MSN CCM Care Management

## 2016-02-10 NOTE — Progress Notes (Signed)
Pt home at this time  Via w/c with  S/o.  Note given to pt for work. No distress or c/o.

## 2016-02-10 NOTE — H&P (Signed)
History and Physical   [facility] - [place] @ [facility] Admission History and Physical [employer], D.O.    Patient Name: Krystal Willis MR#: [id number] Date of Birth: 1990/11/30 Date of Admission: 02/09/2016  Referring MD/NP/PA: Dr. Kerman Passey Primary Care Physician: No PCP Per Patient Outpatient Specialists: None  Patient coming from: Home  Chief Complaint: N/V/D  HPI: Krystal Willis is a 26 y.o. female with no known past medical history was in a usual state of health until about 24 hours prior to arrival in the emergency department when she describes the onset of generalized fatigue and body aches, intractable nausea vomiting and diarrhea. She has had subjective fevers with chills but denies any abdominal pain. Patient states that she has a sick contact us 47-year-old nephew has similar symptoms and she also works as a Recruitment consultant for [employer] so she has been in contact several sick children recently..   Otherwise there has been no change in status. Patient has been taking medication as prescribed and there has been no recent change in medication or diet.  No recent antibiotics.  There has been no recent illness, hospitalizations, travel or sick contacts.    Patient denies  dizziness, chest pain, shortness of breath,  abdominal pain, dysuria/frequency, changes in mental status.   ED Course: In emergency department patient received 3 L IV normal saline however she remains tachycardic in the 130s 140s. She is meeting sepsis criteria with an unclear source therefore hospitalists were contacted requesting admission.  Review of Systems:  CONSTITUTIONAL: Positive  fever/chills, fatigue, weakness, negative weight gain/loss, headache. EYES: No blurry or double vision. ENT: No tinnitus, postnasal drip, redness or soreness of the oropharynx. Negative nasal congestion. RESPIRATORY: No cough, dyspnea, wheeze.  No hemoptysis.  CARDIOVASCULAR: No chest pain, palpitations, syncope,  orthopnea. No lower extremity edema.  GASTROINTESTINAL: Positive nausea, vomiting, diarrhea. No abdominal pain, constipation hematemesis, melena or hematochezia. GENITOURINARY: No dysuria, frequency, hematuria. ENDOCRINE: No polyuria or nocturia. No heat or cold intolerance. HEMATOLOGY: No anemia, bruising, bleeding. INTEGUMENTARY: No rashes, ulcers, lesions. MUSCULOSKELETAL: No arthritis, gout, dyspnea. NEUROLOGIC: No numbness, tingling, ataxia, seizure-type activity, weakness. PSYCHIATRIC: No anxiety, depression, insomnia.   Past Medical History:  Diagnosis Date  . Menometrorrhagia   . Osteochondroma    Left knee    Past Surgical History:  Procedure Laterality Date  . KNEE ARTHROSCOPY WITH OSTEOCHONDRAL DEFECT REPAIR Left 04/15/2015   Procedure: LEFT KNEE ARTHROSCOPY WITH MEDIAL AND PATELLAR CHONDROPLASTY;  Surgeon: Netta Cedars, MD;  Location: [place];  Service: Orthopedics;  Laterality: Left;  . OSTEOCHONDROMA EXCISION Left 04/15/2015   Procedure: OPEN OSTEOCHONDROMA EXCISION;  Surgeon: Netta Cedars, MD;  Location: [place];  Service: Orthopedics;  Laterality: Left;   Patient works as school bus driver  reports that she quit smoking about 2 years ago. Her smoking use included Cigarettes. She has never used smokeless tobacco. She reports that she drinks alcohol. She reports that she does not use drugs. She reports her periods are irregular, about once per year with OCPs  Meds: Patient takes only oral contraceptives. No Known Allergies  Patient denies any past medical history. Family history has been reviewed and confirmed with patient.   Prior to Admission medications   Not on File    Physical Exam: Vitals:   02/09/16 2041 02/09/16 2312 02/09/16 2345 02/10/16 0021  BP: (!) 139/57  139/83 139/62  Pulse: (!) 131 (!) 126 (!) 124 (!) 128  Resp:  18 20   Temp: (!) 100.5 F (  38.1 C)  98.8 F (37.1 C) (!) 101.3 F (38.5 C)  TempSrc: Oral  Oral Oral  SpO2: 96% 99% 98% 98%   Weight:      Height:        GENERAL: 26 y.o.-year-old Obese female patient, well-developed, well-nourished lying in the bed in no acute distress. She is ill-appearing but pleasant and cooperative.   HEENT: Head atraumatic, normocephalic. Pupils equal, round, reactive to light and accommodation. No scleral icterus. Extraocular muscles intact. Nares are patent. Oropharynx is clear. Mucus membranes moist. NECK: Supple, full range of motion. No JVD, no bruit heard. No thyroid enlargement, no tenderness, no cervical lymphadenopathy. CHEST: Normal breath sounds bilaterally. No wheezing, rales, rhonchi or crackles. No use of accessory muscles of respiration.  No reproducible chest wall tenderness.  CARDIOVASCULAR: S1, S2 normal. No murmurs, rubs, or gallops. Cap refill <2 seconds. Pulses intact distally.  ABDOMEN: Soft, nondistended, nontender. No rebound, guarding, rigidity. Normoactive bowel sounds present in all four quadrants. No organomegaly or mass. EXTREMITIES: No pedal edema, cyanosis, or clubbing. No calf tenderness or Homan's sign.  NEUROLOGIC: The patient is alert and oriented x 3. Cranial nerves II through XII are grossly intact with no focal sensorimotor deficit. Muscle strength 5/5 in all extremities. Sensation intact. Gait not checked. PSYCHIATRIC:  Normal affect, mood, thought content. SKIN: Warm, dry, and intact without obvious rash, lesion, or ulcer.    Labs on Admission:  CBC:  Recent Labs Lab 02/09/16 1845  WBC 20.6*  NEUTROABS 18.9*  HGB 13.1  HCT 40.8  MCV 78.7*  PLT [id number]   Basic Metabolic Panel:  Recent Labs Lab 02/09/16 1845  NA 134*  K 4.2  CL 103  CO2 21*  GLUCOSE 128*  BUN 9  CREATININE 0.71  CALCIUM 8.9   GFR: Estimated Creatinine Clearance: 179.4 mL/min (by C-G formula based on SCr of 0.71 mg/dL). Liver Function Tests:  Recent Labs Lab 02/09/16 1845  AST 54*  ALT 32  ALKPHOS 89  BILITOT 1.3*  PROT 8.0  ALBUMIN 3.8   No results for  input(s): LIPASE, AMYLASE in the last 168 hours. No results for input(s): AMMONIA in the last 168 hours. Coagulation Profile: No results for input(s): INR, PROTIME in the last 168 hours. Cardiac Enzymes: No results for input(s): CKTOTAL, CKMB, CKMBINDEX, TROPONINI in the last 168 hours. BNP (last 3 results) No results for input(s): PROBNP in the last 8760 hours. HbA1C: No results for input(s): HGBA1C in the last 72 hours. CBG: No results for input(s): GLUCAP in the last 168 hours. Lipid Profile: No results for input(s): CHOL, HDL, LDLCALC, TRIG, CHOLHDL, LDLDIRECT in the last 72 hours. Thyroid Function Tests: No results for input(s): TSH, T4TOTAL, FREET4, T3FREE, THYROIDAB in the last 72 hours. Anemia Panel: No results for input(s): VITAMINB12, FOLATE, FERRITIN, TIBC, IRON, RETICCTPCT in the last 72 hours. Urine analysis:    Component Value Date/Time   COLORURINE YELLOW (A) 02/09/2016 2215   APPEARANCEUR HAZY (A) 02/09/2016 2215   APPEARANCEUR Hazy 05/22/2011 1719   LABSPEC 1.026 02/09/2016 2215   LABSPEC 1.039 05/22/2011 1719   PHURINE 5.0 02/09/2016 2215   GLUCOSEU NEGATIVE 02/09/2016 2215   GLUCOSEU Negative 05/22/2011 1719   HGBUR NEGATIVE 02/09/2016 2215   BILIRUBINUR NEGATIVE 02/09/2016 2215   BILIRUBINUR Negative 05/22/2011 1719   KETONESUR 5 (A) 02/09/2016 2215   PROTEINUR 30 (A) 02/09/2016 2215   NITRITE NEGATIVE 02/09/2016 2215   LEUKOCYTESUR NEGATIVE 02/09/2016 2215   LEUKOCYTESUR Negative 05/22/2011 1719   Sepsis Labs: @  LABRCNTIP(procalcitonin:4,lacticidven:4) )No results found for this or any previous visit (from the past 240 hour(s)).   Radiological Exams on Admission: Dg Chest 2 View  Result Date: 02/09/2016 CLINICAL DATA:  Flu-like symptoms EXAM: CHEST  2 VIEW COMPARISON:  04/18/2015 FINDINGS: Normal heart size and mediastinal contours. No acute infiltrate or edema. No effusion or pneumothorax. No acute osseous findings. IMPRESSION: Negative chest.  Electronically Signed   By: Monte Fantasia M.D.   On: 02/09/2016 20:31    EKG: Sinus tachycardia 149 bpm with normal axis and nonspecific ST-T wave changes.   Assessment/Plan Active Problems:   Intractable vomiting    This is a 26 y.o. female with no significant past medical history now being admitted with:  1. Sepsis, likely secondary to viral gastroenteritis -Admit to observation, monitor on telemetry secondary tachycardia -We'll start empiric IV antibiotics -Aggressive IV fluid hydration -We'll check abdominal ultrasound secondary to elevated bilirubin -Antipyretics and antiemetics as needed -Follow up blood urine and stool culture. Follow-up GI virus panel sent by ED -Repeat labs in a.m. -Consider ID consultation  Admission status: Observation, telemetry IV Fluids: IV normal saline Diet/Nutrition: Clear liquids Consults called: None  DVT Px: Lovenox, SCDs and early ambulation. Code Status: Full Code  Disposition Plan: To home in less than 24 hours   All the records are reviewed and case discussed with ED provider. Management plans discussed with the patient and/or family who express understanding and agree with plan of care.  Janal Haak D.O. on 02/10/2016 at 12:50 AM Between 7am to 6pm - Pager - [phone] After 6pm go to [email or web address] - [employer] [place] [facility] Office [phone] CC: Primary care physician; No PCP Per Patient   02/10/2016, 12:50 AM

## 2016-02-10 NOTE — Discharge Instructions (Signed)
Diarrhea, Adult Diarrhea is frequent loose and watery bowel movements. Diarrhea can make you feel weak and cause you to become dehydrated. Dehydration can make you tired and thirsty, cause you to have a dry mouth, and decrease how often you urinate. Diarrhea typically lasts 2-3 days. However, it can last longer if it is a sign of something more serious. It is important to treat your diarrhea as told by your health care provider. Follow these instructions at home: Eating and drinking Follow these recommendations as told by your health care provider:  Take an oral rehydration solution (ORS). This is a drink that is sold at pharmacies and retail stores.  Drink clear fluids, such as water, ice chips, diluted fruit juice, and low-calorie sports drinks.  Eat bland, easy-to-digest foods in small amounts as you are able. These foods include bananas, applesauce, rice, lean meats, toast, and crackers.  Avoid drinking fluids that contain a lot of sugar or caffeine, such as energy drinks, sports drinks, and soda.  Avoid alcohol.  Avoid spicy or fatty foods. General instructions  Drink enough fluid to keep your urine clear or pale yellow.  Wash your hands often. If soap and water are not available, use hand sanitizer.  Make sure that all people in your household wash their hands well and often.  Take over-the-counter and prescription medicines only as told by your health care provider.  Rest at home while you recover.  Watch your condition for any changes.  Take a warm bath to relieve any burning or pain from frequent diarrhea episodes.  Keep all follow-up visits as told by your health care provider. This is important. Contact a health care provider if:  You have a fever.  Your diarrhea gets worse.  You have new symptoms.  You cannot keep fluids down.  You feel light-headed or dizzy.  You have a headache  You have muscle cramps. Get help right away if:  You have chest  pain.  You feel extremely weak or you faint.  You have bloody or black stools or stools that look like tar.  You have severe pain, cramping, or bloating in your abdomen.  You have trouble breathing or you are breathing very quickly.  Your heart is beating very quickly.  Your skin feels cold and clammy.  You feel confused.  You have signs of dehydration, such as:  Dark urine, very little urine, or no urine.  Cracked lips.  Dry mouth.  Sunken eyes.  Sleepiness.  Weakness. This information is not intended to replace advice given to you by your health care provider. Make sure you discuss any questions you have with your health care provider. Document Released: 12/12/2001 Document Revised: 05/02/2015 Document Reviewed: 08/28/2014 Elsevier Interactive Patient Education  2017 [place].   Fever, Adult Introduction A fever is an increase in the bodys temperature. It is often defined as a temperature of 100 F (38C) or higher. Short mild or moderate fevers often have no long-term effects. They also often do not need treatment. Moderate or high fevers may make you feel uncomfortable. Sometimes, they can also be a sign of a serious illness or disease. The sweating that may happen with repeated fevers or fevers that last a while may also cause you to not have enough fluid in your body (dehydration). You can take your temperature with a thermometer to see if you have a fever. A measured temperature can change with:  Age.  Time of day.  Where the thermometer is placed:  Mouth (oral).  Rectum (rectal).  Ear (tympanic).  Underarm (axillary).  Forehead (temporal). Follow these instructions at home: Pay attention to any changes in your symptoms. Take these actions to help with your condition:  Take over-the-counter and prescription medicines only as told by your doctor. Follow the dosing instructions carefully.  If you were prescribed an antibiotic medicine, take it as  told by your doctor. Do not stop taking the antibiotic even if you start to feel better.  Rest as needed.  Drink enough fluid to keep your pee (urine) clear or pale yellow.  Sponge yourself or bathe with room-temperature water as needed. This helps to lower your body temperature . Do not use ice water.  Do not wear too many blankets or heavy clothes. Contact a doctor if:  You throw up (vomit).  You cannot eat or drink without throwing up.  You have watery poop (diarrhea).  It hurts when you pee.  Your symptoms do not get better with treatment.  You have new symptoms.  You feel very weak. Get help right away if:  You are short of breath or have trouble breathing.  You are dizzy or you pass out (faint).  You feel confused.  You have signs of not having enough fluid in your body, such as:  A dry mouth.  Peeing less.  Looking pale.  You have very bad pain in your belly (abdomen).  You keep throwing up or having water poop.  You have a skin rash.  Your symptoms suddenly get worse. This information is not intended to replace advice given to you by your health care provider. Make sure you discuss any questions you have with your health care provider. Document Released: 10/01/2007 Document Revised: 05/30/2015 Document Reviewed: 02/15/2014  2017 Elsevier

## 2016-02-11 LAB — URINE CULTURE

## 2016-02-14 LAB — CULTURE, BLOOD (ROUTINE X 2): Culture: NO GROWTH

## 2016-02-15 LAB — CULTURE, BLOOD (ROUTINE X 2): CULTURE: NO GROWTH

## 2016-10-29 ENCOUNTER — Emergency Department: Payer: Self-pay

## 2016-10-29 DIAGNOSIS — Z79899 Other long term (current) drug therapy: Secondary | ICD-10-CM | POA: Insufficient documentation

## 2016-10-29 DIAGNOSIS — Z87891 Personal history of nicotine dependence: Secondary | ICD-10-CM | POA: Insufficient documentation

## 2016-10-29 DIAGNOSIS — J189 Pneumonia, unspecified organism: Secondary | ICD-10-CM | POA: Insufficient documentation

## 2016-10-29 LAB — CBC
HEMATOCRIT: 36.1 % (ref 35.0–47.0)
Hemoglobin: 11.7 g/dL — ABNORMAL LOW (ref 12.0–16.0)
MCH: 25.6 pg — ABNORMAL LOW (ref 26.0–34.0)
MCHC: 32.2 g/dL (ref 32.0–36.0)
MCV: 79.3 fL — AB (ref 80.0–100.0)
Platelets: 346 10*3/uL (ref 150–440)
RBC: 4.56 MIL/uL (ref 3.80–5.20)
RDW: 15.1 % — AB (ref 11.5–14.5)
WBC: 17.4 10*3/uL — ABNORMAL HIGH (ref 3.6–11.0)

## 2016-10-29 LAB — TROPONIN I: Troponin I: <0.03 ng/mL (ref <0.03)

## 2016-10-29 LAB — BASIC METABOLIC PANEL
Anion gap: 6 (ref 5–15)
BUN: 14 mg/dL (ref 6–20)
CHLORIDE: 107 mmol/L (ref 101–111)
CO2: 25 mmol/L (ref 22–32)
Calcium: 8.9 mg/dL (ref 8.9–10.3)
Creatinine, Ser: 0.78 mg/dL (ref 0.44–1.00)
GFR calc Af Amer: >60 mL/min (ref >60)
GFR calc non Af Amer: >60 mL/min (ref >60)
GLUCOSE: 115 mg/dL — AB (ref 65–99)
POTASSIUM: 3.7 mmol/L (ref 3.5–5.1)
Sodium: 138 mmol/L (ref 135–145)

## 2016-10-29 NOTE — ED Triage Notes (Signed)
Patient c/o congestion, productive cough with yellow sputum and blood tinged. Patient report SOB last week currently denies.

## 2016-10-30 ENCOUNTER — Emergency Department: Payer: Self-pay

## 2016-10-30 ENCOUNTER — Emergency Department
Admission: EM | Admit: 2016-10-30 | Discharge: 2016-10-30 | Disposition: A | Discharge status: Home / Self Care | Payer: Self-pay | Attending: Emergency Medicine | Admitting: Emergency Medicine

## 2016-10-30 DIAGNOSIS — J189 Pneumonia, unspecified organism: Secondary | ICD-10-CM

## 2016-10-30 MED ORDER — SODIUM CHLORIDE 0.9 % IV BOLUS (SEPSIS): 1000.0000 mL | Freq: Once | INTRAVENOUS | Status: DC

## 2016-10-30 MED ORDER — IOPAMIDOL (ISOVUE-370) INJECTION 76%
75.0000 mL | Freq: Once | INTRAVENOUS | Status: AC | PRN
  Administered 2016-10-30: 75 mL via INTRAVENOUS

## 2016-10-30 MED ORDER — AZITHROMYCIN 250 MG PO TABS: 250.0000 mg | ORAL_TABLET | Freq: Every day | ORAL | 0 refills | Status: DC

## 2016-10-30 MED ORDER — AZITHROMYCIN 500 MG PO TABS
500.0000 mg | ORAL_TABLET | Freq: Once | ORAL | Status: AC
Start: 2016-10-30 — End: 2016-10-30
  Administered 2016-10-30: 500 mg via ORAL
  Filled 2016-10-30: qty 1

## 2016-10-30 NOTE — ED Notes (Signed)
Pt states coughing up blood that began 10/25. States dark red in color. Happened a few times. Past few days has been coughing but denies pain, denies N&V&D&fever. Denies abd pain. Denies congestion or sinus pressure. Alert, oriented x4. No distress noted.

## 2016-10-30 NOTE — Discharge Instructions (Signed)
1. Finish antibiotic as prescribed. Start the next dose on Saturday. 2. Return to the ER for worsening symptoms, persistent vomiting, difficulty breathing or other concerns.

## 2016-10-30 NOTE — ED Provider Notes (Signed)
[facility] Emergency Department Provider Note   ____________________________________________   First MD Initiated Contact with Patient 10/30/16 [phone]     (approximate)  I have reviewed the triage vital signs and the nursing notes.   HISTORY  Chief Complaint Hemoptysis    HPI Krystal Willis is a 26 y.o. female who presents to the ED from home with a chief complaint of hemoptysis. Patient complains of a one-day history of cough productive of yellow sputum. Had one episode of coughing up a dime-sized blood clot. Symptoms sometimes associated with shortness of breath and chest tightness.Denies associated fever, chills, abdominal pain, nausea, vomiting. Denies recent travel, trauma or OCP use. Nothing makes her symptoms better or worse.   Past Medical History:  Diagnosis Date  . Menometrorrhagia   . Osteochondroma    Left knee    Patient Active Problem List   Diagnosis Date Noted  . Intractable vomiting 02/09/2016    Past Surgical History:  Procedure Laterality Date  . KNEE ARTHROSCOPY WITH OSTEOCHONDRAL DEFECT REPAIR Left 04/15/2015   Procedure: LEFT KNEE ARTHROSCOPY WITH MEDIAL AND PATELLAR CHONDROPLASTY;  Surgeon: Netta Cedars, MD;  Location: [place];  Service: Orthopedics;  Laterality: Left;  . OSTEOCHONDROMA EXCISION Left 04/15/2015   Procedure: OPEN OSTEOCHONDROMA EXCISION;  Surgeon: Netta Cedars, MD;  Location: [place];  Service: Orthopedics;  Laterality: Left;    Prior to Admission medications   Medication Sig Start Date End Date Taking? Authorizing Provider  azithromycin (ZITHROMAX) 250 MG tablet Take 1 tablet (250 mg total) by mouth daily. 10/30/16   Paulette Blanch, MD  norgestimate-ethinyl estradiol (ORTHO-CYCLEN,SPRINTEC,PREVIFEM) 0.25-35 MG-MCG tablet Take 1 tablet by mouth daily. 02/10/16   Fritzi Mandes, MD    Allergies Patient has no known allergies.  No family history on file.  Social History Social History  Substance Use Topics  .  Smoking status: Former Smoker    Types: Cigarettes    Quit date: 01/05/2014  . Smokeless tobacco: Never Used  . Alcohol use Yes     Comment: occasional     Review of Systems  Constitutional: No fever/chills. Eyes: No visual changes. ENT: No sore throat. Cardiovascular: Denies chest pain. Respiratory: positive for cough and hemoptysis. Denies shortness of breath. Gastrointestinal: No abdominal pain.  No nausea, no vomiting.  No diarrhea.  No constipation. Genitourinary: Negative for dysuria. Musculoskeletal: Negative for back pain. Skin: Negative for rash. Neurological: Negative for headaches, focal weakness or numbness.   ____________________________________________   PHYSICAL EXAM:  VITAL SIGNS: ED Triage Vitals  Enc Vitals Group     BP 10/29/16 2221 (!) 170/93     Pulse Rate 10/29/16 2221 (!) 102     Resp 10/29/16 2221 18     Temp 10/29/16 2221 98.1 F (36.7 C)     Temp Source 10/29/16 2221 Oral     SpO2 10/29/16 2221 98 %     Weight 10/29/16 2222 (!) 370 lb (167.8 kg)     Height 10/29/16 2222 5\' 6"  (1.676 m)     Head Circumference --      Peak Flow --      Pain Score 10/29/16 2220 0     Pain Loc --      Pain Edu? --      Excl. in [place]? --     Constitutional: Alert and oriented. Well appearing and in no acute distress. Eyes: Conjunctivae are normal. PERRL. EOMI. Head: Atraumatic. Nose: No congestion/rhinnorhea. Mouth/Throat: Mucous membranes are moist.  Oropharynx non-erythematous.  Mild raspy voice. Neck: No stridor.   Cardiovascular: Normal rate, regular rhythm. Grossly normal heart sounds.  Good peripheral circulation. Respiratory: Normal respiratory effort.  No retractions. Lungs CTAB. Gastrointestinal: Obese. Soft and nontender. No distention. No abdominal bruits. No CVA tenderness. Musculoskeletal: No lower extremity tenderness nor edema.  No joint effusions. Neurologic:  Normal speech and language. No gross focal neurologic deficits are appreciated. No  gait instability. Skin:  Skin is warm, dry and intact. No rash noted. Psychiatric: Mood and affect are normal. Speech and behavior are normal.  ____________________________________________   LABS (all labs ordered are listed, but only abnormal results are displayed)  Labs Reviewed  BASIC METABOLIC PANEL - Abnormal; Notable for the following:       Result Value   Glucose, Bld 115 (*)    All other components within normal limits  CBC - Abnormal; Notable for the following:    WBC 17.4 (*)    Hemoglobin 11.7 (*)    MCV 79.3 (*)    MCH 25.6 (*)    RDW 15.1 (*)    All other components within normal limits  TROPONIN I   ____________________________________________  EKG  ED ECG REPORT I, Inas Avena J, the attending physician, personally viewed and interpreted this ECG.   Date: 10/30/2016  EKG Time: 2227  Rate: 98  Rhythm: normal EKG, normal sinus rhythm  Axis: normal  Intervals:none  ST&T Change: nonspecific  ____________________________________________  RADIOLOGY  Dg Chest 2 View  Result Date: 10/29/2016 CLINICAL DATA:  26 year old female with productive cough and hemoptysis. EXAM: CHEST  2 VIEW COMPARISON:  Chest radiograph dated 02/09/2016 FINDINGS: There is diffuse interstitial and peribronchial prominence which may represent an atypical pneumonitis or less likely interstitial edema. There is no focal consolidation. No pleural effusion or pneumothorax. The cardiac silhouette is within normal limits. No acute osseous pathology. IMPRESSION: Findings likely represent an atypical pneumonitis and less likely interstitial edema. Clinical correlation is recommended. No focal consolidation. Electronically Signed   By: Anner Crete M.D.   On: 10/29/2016 22:53   Ct Angio Chest Pe W/cm &/or Wo Cm  Result Date: 10/30/2016 CLINICAL DATA:  Productive cough and hemoptysis beginning yesterday. EXAM: CT ANGIOGRAPHY CHEST WITH CONTRAST TECHNIQUE: Multidetector CT imaging of the chest  was performed using the standard protocol during bolus administration of intravenous contrast. Multiplanar CT image reconstructions and MIPs were obtained to evaluate the vascular anatomy. CONTRAST:  134 cc Isovue 370 COMPARISON:  Chest radiograph October 29, 2016 and CT chest May 22, 2011 FINDINGS: Large body habitus results in overall noisy image quality. Per technologist, computer triggered bolus timing failed twice. CARDIOVASCULAR: Suboptimal contrast opacification of the pulmonary artery's (120 Hounsfield units, target is 250 Hounsfield units. Main pulmonary artery is not enlarged. No pulmonary arterial filling defects to the proximal segmental levels. Heart size is top-normal, no right heart strain. No pericardial effusion. Thoracic aorta is normal course and caliber, unremarkable. MEDIASTINUM/NODES: No lymphadenopathy by CT size criteria. Residual thymic tissue. LUNGS/PLEURA: Tracheobronchial tree is patent, no pneumothorax. Mild bronchial wall thickening. No pleural effusions, focal consolidations, pulmonary nodules or masses. UPPER ABDOMEN: Included view of the abdomen is unremarkable. MUSCULOSKELETAL: Visualized soft tissues and included osseous structures appear normal. Review of the MIP images confirms the above findings. IMPRESSION: 1. Technically and habitus limited examination without acute pulmonary embolism. 2. Mild bronchial wall thickening seen with reactive airway disease or bronchitis without pneumonia. 3. Borderline cardiomegaly.  The Electronically Signed   By: Elon Alas M.D.   On:  10/30/2016 05:09    ____________________________________________   PROCEDURES  Procedure(s) performed: None  Procedures  Critical Care performed: No  ____________________________________________   INITIAL IMPRESSION / ASSESSMENT AND PLAN / ED COURSE  As part of my medical decision making, I reviewed the following data within the [place] History obtained from family,  Nursing notes reviewed and incorporated, Labs reviewed, EKG interpreted, Radiograph reviewed and Notes from prior ED visits.   26 year old female who presents with productive cough and hemoptysis.laboratory and imaging results remarkable for leukocytosis and pneumonitis seen on chest x-ray. Differential includes, but is not limited to, viral syndrome, bronchitis including COPD exacerbation, pneumonia, reactive airway disease including asthma, CHF including exacerbation with or without pulmonary/interstitial edema, pneumothorax, ACS, thoracic trauma, and pulmonary embolism. Given her complaint of hemoptysis and transient chest tightness, will obtain CT angio chest to evaluate for pulmonary embolism.  Clinical Course as of Oct 31 623  Fri Oct 30, 2016  0530 Updated patient and mother of CT imaging results. Will cover patient with azithromycin for atypical pneumonitis. Strict return precautions given. Both verbalize understanding and agree with plan of care.  [JS]    Clinical Course User Index [JS] Paulette Blanch, MD     ____________________________________________   FINAL CLINICAL IMPRESSION(S) / ED DIAGNOSES  Final diagnoses:  Pneumonitis      NEW MEDICATIONS STARTED DURING THIS VISIT:  Discharge Medication List as of 10/30/2016  5:33 AM    START taking these medications   Details  azithromycin (ZITHROMAX) 250 MG tablet Take 1 tablet (250 mg total) by mouth daily., Starting Fri 10/30/2016, Print         Note:  This document was prepared using Dragon voice recognition software and may include unintentional dictation errors.    Paulette Blanch, MD 10/30/16 [phone]

## 2016-10-30 NOTE — ED Notes (Signed)
Dr. Sung at bedside.  

## 2016-10-30 NOTE — ED Notes (Signed)
Attempted IV x 2, unsuccessful. Asked Ramond Dial to attempt.

## 2017-03-26 ENCOUNTER — Other Ambulatory Visit: Payer: Self-pay

## 2017-03-26 ENCOUNTER — Emergency Department
Admission: EM | Admit: 2017-03-26 | Discharge: 2017-03-26 | Disposition: A | Discharge status: Home / Self Care | Payer: BLUE CROSS/BLUE SHIELD | Attending: Student in an Organized Health Care Education/Training Program | Admitting: Student in an Organized Health Care Education/Training Program

## 2017-03-26 ENCOUNTER — Encounter: Payer: Self-pay | Admitting: Emergency Medicine

## 2017-03-26 DIAGNOSIS — J00 Acute nasopharyngitis [common cold]: Secondary | ICD-10-CM | POA: Insufficient documentation

## 2017-03-26 DIAGNOSIS — J029 Acute pharyngitis, unspecified: Secondary | ICD-10-CM

## 2017-03-26 DIAGNOSIS — Z87891 Personal history of nicotine dependence: Secondary | ICD-10-CM | POA: Insufficient documentation

## 2017-03-26 DIAGNOSIS — Z79899 Other long term (current) drug therapy: Secondary | ICD-10-CM | POA: Insufficient documentation

## 2017-03-26 LAB — GROUP A STREP BY PCR: GROUP A STREP BY PCR: NOT DETECTED

## 2017-03-26 MED ORDER — BENZONATATE 100 MG PO CAPS: ORAL_CAPSULE | ORAL | 0 refills | Status: DC

## 2017-03-26 MED ORDER — FLUTICASONE PROPIONATE 50 MCG/ACT NA SUSP: 2.0000 | Freq: Every day | NASAL | 0 refills | Status: DC

## 2017-03-26 NOTE — ED Notes (Signed)
Pt verbalized understanding of discharge instructions. NAD at this time. 

## 2017-03-26 NOTE — Discharge Instructions (Signed)
Your rapid strep test is negative. Your symptoms are likely due to post-nasal drainage. Take the prescription meds as directed. Consider starting OTC Delsym for additional cough relief. You may find benefit in taking pseudoephedrine for sinus pressure relief.

## 2017-03-26 NOTE — ED Triage Notes (Signed)
States she developed sore throat and cough yesterday  Sx's worse this   Voice is hoarse

## 2017-03-26 NOTE — ED Provider Notes (Signed)
[facility] Emergency Department Provider Note ____________________________________________  Time seen: 1212  I have reviewed the triage vital signs and the nursing notes.  HISTORY  Chief Complaint  Sore Throat and Cough  HPI Krystal Willis is a 27 y.o. female presents to the ED for evaluation and management of a 1-day complaint of sore throat and cough.  She denies fevers, chills, sweats, or nausea. She also notes nasal drainage and sinus congestion.  Past Medical History:  Diagnosis Date  . Menometrorrhagia   . Osteochondroma    Left knee    Patient Active Problem List   Diagnosis Date Noted  . Intractable vomiting 02/09/2016    Past Surgical History:  Procedure Laterality Date  . KNEE ARTHROSCOPY WITH OSTEOCHONDRAL DEFECT REPAIR Left 04/15/2015   Procedure: LEFT KNEE ARTHROSCOPY WITH MEDIAL AND PATELLAR CHONDROPLASTY;  Surgeon: Netta Cedars, MD;  Location: [place];  Service: Orthopedics;  Laterality: Left;  . OSTEOCHONDROMA EXCISION Left 04/15/2015   Procedure: OPEN OSTEOCHONDROMA EXCISION;  Surgeon: Netta Cedars, MD;  Location: [place];  Service: Orthopedics;  Laterality: Left;    Prior to Admission medications   Medication Sig Start Date End Date Taking? Authorizing Provider  azithromycin (ZITHROMAX) 250 MG tablet Take 1 tablet (250 mg total) by mouth daily. 10/30/16   Paulette Blanch, MD  benzonatate (TESSALON PERLES) 100 MG capsule Take 1-2 tabs TID prn cough 03/26/17   Jermaine Tholl, Dannielle Karvonen, PA-C  fluticasone (FLONASE) 50 MCG/ACT nasal spray Place 2 sprays into both nostrils daily. 03/26/17   Hillis Mcphatter, Dannielle Karvonen, PA-C  norgestimate-ethinyl estradiol (ORTHO-CYCLEN,SPRINTEC,PREVIFEM) 0.25-35 MG-MCG tablet Take 1 tablet by mouth daily. 02/10/16   Fritzi Mandes, MD    Allergies Patient has no known allergies.  History reviewed. No pertinent family history.  Social History Social History   Tobacco Use  . Smoking status: Former Smoker    Types:  Cigarettes    Last attempt to quit: 01/05/2014    Years since quitting: 3.2  . Smokeless tobacco: Never Used  Substance Use Topics  . Alcohol use: Yes    Comment: occasional   . Drug use: No    Review of Systems  Constitutional: Negative for fever. Eyes: Negative for eye drainage ENT: Positive for sore throat. Cardiovascular: Negative for chest pain. Respiratory: Negative for shortness of breath. Gastrointestinal: Negative for abdominal pain, vomiting and diarrhea. Genitourinary: Negative for dysuria. Musculoskeletal: Negative for back pain. Skin: Negative for rash. Neurological: Negative for headaches, focal weakness or numbness. ____________________________________________  PHYSICAL EXAM:  VITAL SIGNS: ED Triage Vitals  Enc Vitals Group     BP 03/26/17 1128 133/66     Pulse Rate 03/26/17 1128 (!) 129     Resp 03/26/17 1128 20     Temp 03/26/17 1128 99.1 F (37.3 C)     Temp Source 03/26/17 1128 Oral     SpO2 03/26/17 1128 96 %     Weight 03/26/17 1128 (!) 350 lb (158.8 kg)     Height 03/26/17 1128 5\' 6"  (1.676 m)     Head Circumference --      Peak Flow --      Pain Score 03/26/17 1125 8     Pain Loc --      Pain Edu? --      Excl. in [place]? --     Constitutional: Alert and oriented. Well appearing and in no distress. Head: Normocephalic and atraumatic. Eyes: Conjunctivae are normal. PERRL. Normal extraocular movements Ears: Canals clear. TMs intact  bilaterally.  TMs are mildly injected and dull.  There is serous effusion noted bilaterally. Nose: No congestion/rhinorrhea/epistaxis.  Turbinates are pink, moist, and enlarged.  Copious amounts of thin rhinorrhea are appreciated. Mouth/Throat: Mucous membranes are moist.  Uvula is midline and tonsils are enlarged, erythematous, but without exudate, injection, or edema. Neck: Supple. No thyromegaly. Hematological/Lymphatic/Immunological: No cervical lymphadenopathy. Cardiovascular: Normal rate, regular rhythm. Normal  distal pulses. Respiratory: Normal respiratory effort. No wheezes/rales/rhonchi. Gastrointestinal: Soft and nontender. No distention. ____________________________________________   LABS (pertinent positives/negatives) Labs Reviewed  GROUP A STREP BY PCR  ____________________________________________  INITIAL IMPRESSION / ASSESSMENT AND PLAN / ED COURSE  Patient presents to the ED for evaluation of a 1 day complaint of sore throat, cough, sinus congestion.  Patient is reassured by her negative rapid strep test.  Her symptoms likely represent a viral rhinitis and postnasal drainage.  She will be discharged with prescriptions for Tessalon Perles and Flonase to dose as directed.  She is advised to start an over-the-counter daily allergy medicine as well as a decongestant for sinus pressure relief.  She will follow-up with her provider or [employer] as needed.  ____________________________________________  FINAL CLINICAL IMPRESSION(S) / ED DIAGNOSES  Final diagnoses:  Sore throat  Acute rhinitis      Tijah Hane, Dannielle Karvonen, PA-C 03/26/17 1724    Merlyn Lot, MD 03/27/17 [phone]

## 2017-09-29 DIAGNOSIS — Z6841 Body Mass Index (BMI) 40.0 and over, adult: Secondary | ICD-10-CM | POA: Diagnosis not present

## 2018-01-12 ENCOUNTER — Telehealth: Payer: Self-pay

## 2018-01-12 NOTE — Telephone Encounter (Signed)
Call received on nurse line from this number.  Unable to make out any words in msg.  [phone]  Left msg to call back.  Sent msg to schedulers that if she calls to sched appt b/c we haven't seen her in 36yrs next month.

## 2018-01-26 DIAGNOSIS — Z6841 Body Mass Index (BMI) 40.0 and over, adult: Secondary | ICD-10-CM | POA: Diagnosis not present

## 2018-03-02 ENCOUNTER — Telehealth: Payer: Self-pay

## 2018-03-02 NOTE — Telephone Encounter (Signed)
Pt called after hour nurse at 8:55pm last night c/o bleeding for 71m and bc not working.  Was adv by after hour nurse to go to ED.  [phone]  Left detailed msg to call and make appt.  If saturating pad [id number] it is our policy that she go to ED.

## 2018-03-04 ENCOUNTER — Ambulatory Visit (INDEPENDENT_AMBULATORY_CARE_PROVIDER_SITE_OTHER): Payer: BLUE CROSS/BLUE SHIELD | Admitting: Obstetrics & Gynecology

## 2018-03-04 ENCOUNTER — Encounter: Payer: Self-pay | Admitting: Obstetrics & Gynecology

## 2018-03-04 ENCOUNTER — Other Ambulatory Visit (HOSPITAL_COMMUNITY)
Admission: RE | Admit: 2018-03-04 | Discharge: 2018-03-04 | Disposition: A | Discharge status: Home / Self Care | Payer: Self-pay | Source: Ambulatory Visit | Attending: Obstetrics & Gynecology | Admitting: Obstetrics & Gynecology

## 2018-03-04 VITALS — BP 120/80 | Ht 66.0 in

## 2018-03-04 DIAGNOSIS — Z124 Encounter for screening for malignant neoplasm of cervix: Secondary | ICD-10-CM

## 2018-03-04 DIAGNOSIS — N921 Excessive and frequent menstruation with irregular cycle: Secondary | ICD-10-CM | POA: Diagnosis not present

## 2018-03-04 MED ORDER — SPRINTEC 28 0.25-35 MG-MCG PO TABS: 1.0000 | ORAL_TABLET | Freq: Every day | ORAL | 3 refills | Status: DC

## 2018-03-04 MED ORDER — MEDROXYPROGESTERONE ACETATE 10 MG PO TABS: 10.0000 mg | ORAL_TABLET | Freq: Every day | ORAL | 0 refills | Status: DC

## 2018-03-04 NOTE — Progress Notes (Signed)
HPI:      [name] is a 28 y.o. G0P0000 who LMP was No LMP recorded. (Menstrual status: Irregular Periods)., presents today for a problem visit.  She complains of menometrorrhagia that  began several months ago and its severity is described as severe.  She has irreg periods usually off of pills, and has been doing well w Sprintec OCP w light and even skipping periods; then stopped (lapse in Rx) and restarted a different brand pill that has led to the last 3 mos of continuous and heavy bleeding, still today.  They are associated with mild menstrual cramping.  She has used the following for attempts at control: tampon and pad.  Previous evaluation: none. Prior Diagnosis: dysfunctional uterine bleeding.  Also has morbid obesity concerns; planning surgery in summer.  She has female partner.  Hx of STDs: none. She is premenopausal.  PMHx: She  has a past medical history of Menometrorrhagia and Osteochondroma. Also,  has a past surgical history that includes Knee arthroscopy with osteochondral defect repair (Left, 04/15/2015) and Osteochondroma excision (Left, 04/15/2015)., family history is not on file.,  reports that she quit smoking about 4 years ago. Her smoking use included cigarettes. She has never used smokeless tobacco. She reports current alcohol use. She reports that she does not use drugs.  She  Current Outpatient Medications:  .  azithromycin (ZITHROMAX) 250 MG tablet, Take 1 tablet (250 mg total) by mouth daily. (Patient not taking: Reported on 03/04/2018), Disp: 4 each, Rfl: 0 .  benzonatate (TESSALON PERLES) 100 MG capsule, Take 1-2 tabs TID prn cough (Patient not taking: Reported on 03/04/2018), Disp: 30 capsule, Rfl: 0 .  fluticasone (FLONASE) 50 MCG/ACT nasal spray, Place 2 sprays into both nostrils daily. (Patient not taking: Reported on 03/04/2018), Disp: 16 g, Rfl: 0 .  medroxyPROGESTERone (PROVERA) 10 MG tablet, Take 1 tablet (10 mg total) by mouth daily for 10 days., Disp: 10  tablet, Rfl: 0 .  SPRINTEC 28 0.25-35 MG-MCG tablet, Take 1 tablet by mouth daily., Disp: 84 tablet, Rfl: 3  Also, has No Known Allergies.  Review of Systems  Constitutional: Negative for chills, fever and malaise/fatigue.  HENT: Negative for congestion, sinus pain and sore throat.   Eyes: Negative for blurred vision and pain.  Respiratory: Negative for cough and wheezing.   Cardiovascular: Negative for chest pain and leg swelling.  Gastrointestinal: Negative for abdominal pain, constipation, diarrhea, heartburn, nausea and vomiting.  Genitourinary: Negative for dysuria, frequency, hematuria and urgency.  Musculoskeletal: Negative for back pain, joint pain, myalgias and neck pain.  Skin: Negative for itching and rash.  Neurological: Negative for dizziness, tremors and weakness.  Endo/Heme/Allergies: Does not bruise/bleed easily.  Psychiatric/Behavioral: Negative for depression. The patient is not nervous/anxious and does not have insomnia.     Objective: BP 120/80   Ht 5\' 6"  (1.676 m)   BMI 56.49 kg/m  Physical Exam Constitutional:      General: She is not in acute distress.    Appearance: She is well-developed.  Genitourinary:     Pelvic exam was performed with patient supine.     Vagina, uterus and rectum normal.     No lesions in the vagina.     No vaginal bleeding.     No cervical motion tenderness, friability, lesion or polyp.     Uterus is mobile.     Uterus is not enlarged.     No uterine mass detected.    Uterus is midaxial.  Right and left adnexa are palpable.     Genitourinary Comments: Obesity limits exam. Cervix seen with long speculum Blood present today Difficult to palpate adnexa  HENT:     Head: Normocephalic and atraumatic. No laceration.     Right Ear: Hearing normal.     Left Ear: Hearing normal.     Mouth/Throat:     Pharynx: Uvula midline.  Eyes:     Pupils: Pupils are equal, round, and reactive to light.  Neck:     Musculoskeletal: Normal  range of motion and neck supple.     Thyroid: No thyromegaly.  Cardiovascular:     Rate and Rhythm: Normal rate and regular rhythm.     Heart sounds: No murmur. No friction rub. No gallop.   Pulmonary:     Effort: Pulmonary effort is normal. No respiratory distress.     Breath sounds: Normal breath sounds. No wheezing.  Chest:     Breasts:        Right: No mass, skin change or tenderness.        Left: No mass, skin change or tenderness.  Abdominal:     General: Bowel sounds are normal. There is no distension.     Palpations: Abdomen is soft.     Tenderness: There is no abdominal tenderness. There is no rebound.  Musculoskeletal: Normal range of motion.  Neurological:     Mental Status: She is alert and oriented to person, place, and time.     Cranial Nerves: No cranial nerve deficit.  Skin:    General: Skin is warm and dry.  Psychiatric:        Judgment: Judgment normal.  Vitals signs reviewed.   ASSESSMENT/PLAN:  dysfunctional uterine bleeding and menometrorrhagia- new diagnosis and current concern   Screening for cervical cancer    -  Primary   Relevant Orders   Cytology - PAP   Menometrorrhagia        Provera, then restart Sprintec (brand) as she did well in past with this one.  Alternatives of progesterone only pill (does not need birth control) and Mirena (although may be diffuclt exam for placement based on todays exam, due to obesity) discussed.  Depo not a good option due to weight gain potential.  Patch and Nexplanon not good options based on weight/BMI.  Plan [place] if persists despite therapy  PAP today  Barnett Applebaum, MD, [facility], [facility] 03/04/2018  10:00 AM

## 2018-03-04 NOTE — Patient Instructions (Signed)
Provera 10mg  daily for 10 days, start now Then start birth control pack on day 11:  Ethinyl Estradiol; Norgestimate tablets What is this medicine? ETHINYL ESTRADIOL; NORGESTIMATE (ETH in il es tra DYE ole; nor JES ti mate) is an oral contraceptive. The products combine two types of female hormones, an estrogen and a progestin. They are used to prevent ovulation and pregnancy. Some products are also used to treat acne in females. This medicine may be used for other purposes; ask your health care provider or pharmacist if you have questions. COMMON BRAND NAME(S): Estarylla, Mili, MONO-LINYAH, MonoNessa, Norgestimate/Ethinyl Estradiol, Ortho Tri-Cyclen, Ortho Tri-Cyclen Lo, Ortho-Cyclen, Previfem, Sprintec, Tri-Estarylla, TRI-LINYAH, Tri-Lo-Estarylla, Tri-Lo-Marzia, Tri-Lo-Mili, Tri-Lo-Sprintec, Tri-Mili, Tri-Previfem, Tri-Sprintec, Tri-VyLibra, Trinessa, Trinessa Lo, VyLibra What should I tell my health care provider before I take this medicine? They need to know if you have or ever had any of these conditions: -abnormal vaginal bleeding -blood vessel disease or blood clots -breast, cervical, endometrial, ovarian, liver, or uterine cancer -diabetes -gallbladder disease -heart disease or recent heart attack -high blood pressure -high cholesterol -kidney disease -liver disease -migraine headaches -stroke -systemic lupus erythematosus (SLE) -tobacco smoker -an unusual or allergic reaction to estrogens, progestins, other medicines, foods, dyes, or preservatives -pregnant or trying to get pregnant -breast-feeding How should I use this medicine? Take this medicine by mouth. To reduce nausea, this medicine may be taken with food. Follow the directions on the prescription label. Take this medicine at the same time each day and in the order directed on the package. Do not take your medicine more often than directed. Contact your pediatrician regarding the use of this medicine in children. Special  care may be needed. This medicine has been used in female children who have started having menstrual periods. A patient package insert for the product will be given with each prescription and refill. Read this sheet carefully each time. The sheet may change frequently. Overdosage: If you think you have taken too much of this medicine contact a poison control center or emergency room at once. NOTE: This medicine is only for you. Do not share this medicine with others. What if I miss a dose? If you miss a dose, refer to the patient information sheet you received with your medicine for direction. If you miss more than one pill, this medicine may not be as effective and you may need to use another form of birth control. What may interact with this medicine? Do not take this medicine with the following medication: -dasabuvir; ombitasvir; paritaprevir; ritonavir -ombitasvir; paritaprevir; ritonavir This medicine may also interact with the following medications: -acetaminophen -antibiotics or medicines for infections, especially rifampin, rifabutin, rifapentine, and griseofulvin, and possibly penicillins or tetracyclines -aprepitant -ascorbic acid (vitamin C) -atorvastatin -barbiturate medicines, such as phenobarbital -bosentan -carbamazepine -caffeine -clofibrate -cyclosporine -dantrolene -doxercalciferol -felbamate -grapefruit juice -hydrocortisone -medicines for anxiety or sleeping problems, such as diazepam or temazepam -medicines for diabetes, including pioglitazone -mineral oil -modafinil -mycophenolate -nefazodone -oxcarbazepine -phenytoin -prednisolone -ritonavir or other medicines for HIV infection or AIDS -rosuvastatin -selegiline -soy isoflavones supplements -St. John's wort -tamoxifen or raloxifene -theophylline -thyroid hormones -topiramate -warfarin This list may not describe all possible interactions. Give your health care provider a list of all the medicines,  herbs, non-prescription drugs, or dietary supplements you use. Also tell them if you smoke, drink alcohol, or use illegal drugs. Some items may interact with your medicine. What should I watch for while using this medicine? Visit your doctor or health care professional for regular checks on your progress.  You will need a regular breast and pelvic exam and Pap smear while on this medicine. You should also discuss the need for regular mammograms with your health care professional, and follow his or her guidelines for these tests. This medicine can make your body retain fluid, making your fingers, hands, or ankles swell. Your blood pressure can go up. Contact your doctor or health care professional if you feel you are retaining fluid. Use an additional method of contraception during the first cycle that you take these tablets. If you have any reason to think you are pregnant, stop taking this medicine right away and contact your doctor or health care professional. If you are taking this medicine for hormone related problems, it may take several cycles of use to see improvement in your condition. Do not use this product if you smoke and are over 56 years of age. Smoking increases the risk of getting a blood clot or having a stroke while you are taking birth control pills, especially if you are more than 28 years old. If you are a smoker who is 75 years of age or younger, you are strongly advised not to smoke while taking birth control pills. This medicine can make you more sensitive to the sun. Keep out of the sun. If you cannot avoid being in the sun, wear protective clothing and use sunscreen. Do not use sun lamps or tanning beds/booths. If you wear contact lenses and notice visual changes, or if the lenses begin to feel uncomfortable, consult your eye care specialist. In some women, tenderness, swelling, or minor bleeding of the gums may occur. Notify your dentist if this happens. Brushing and flossing your  teeth regularly may help limit this. See your dentist regularly and inform your dentist of the medicines you are taking. If you are going to have elective surgery, you may need to stop taking this medicine before the surgery. Consult your health care professional for advice. This medicine does not protect you against HIV infection (AIDS) or any other sexually transmitted diseases. What side effects may I notice from receiving this medicine? Side effects that you should report to your doctor or health care professional as soon as possible: -breast tissue changes or discharge -changes in vaginal bleeding during your period or between your periods -chest pain -coughing up blood -dizziness or fainting spells -headaches or migraines -leg, arm or groin pain -severe or sudden headaches -stomach pain (severe) -sudden shortness of breath -sudden loss of coordination, especially on one side of the body -speech problems -symptoms of vaginal infection like itching, irritation or unusual discharge -tenderness in the upper abdomen -vomiting -weakness or numbness in the arms or legs, especially on one side of the body -yellowing of the eyes or skin Side effects that usually do not require medical attention (report to your doctor or health care professional if they continue or are bothersome): -breakthrough bleeding and spotting that continues beyond the 3 initial cycles of pills -breast tenderness -mood changes, anxiety, depression, frustration, anger, or emotional outbursts -increased sensitivity to sun or ultraviolet light -nausea -skin rash, acne, or brown spots on the skin -weight gain (slight) This list may not describe all possible side effects. Call your doctor for medical advice about side effects. You may report side effects to FDA at 1-800-FDA-1088. Where should I keep my medicine? Keep out of the reach of children. Store at room temperature between 15 and 30 degrees C (59 and 86 degrees  F). Throw away any unused medicine after  the expiration date. NOTE: This sheet is a summary. It may not cover all possible information. If you have questions about this medicine, talk to your doctor, pharmacist, or health care provider.  2019 Elsevier/Gold Standard (2015-09-02 08:09:09)

## 2018-03-08 LAB — CYTOLOGY - PAP
ADEQUACY: ABSENT
Diagnosis: NEGATIVE

## 2018-03-16 DIAGNOSIS — Z6841 Body Mass Index (BMI) 40.0 and over, adult: Secondary | ICD-10-CM | POA: Diagnosis not present

## 2018-04-14 ENCOUNTER — Other Ambulatory Visit: Payer: Self-pay | Admitting: Obstetrics & Gynecology

## 2018-04-14 ENCOUNTER — Telehealth: Payer: Self-pay | Admitting: Obstetrics & Gynecology

## 2018-04-14 NOTE — Telephone Encounter (Signed)
Patient is schedule 04/20/18

## 2018-04-14 NOTE — Telephone Encounter (Signed)
Advise

## 2018-04-14 NOTE — Telephone Encounter (Signed)
Pt states bc isn't working again and needs refill of rx to stop bleeding.  [phone]

## 2018-04-14 NOTE — Telephone Encounter (Signed)
Sch telephone appt to assess her symptoms of bleeding and birth control use.  Today is fine.

## 2018-04-14 NOTE — Telephone Encounter (Signed)
Please advise 

## 2018-04-20 ENCOUNTER — Ambulatory Visit (INDEPENDENT_AMBULATORY_CARE_PROVIDER_SITE_OTHER): Payer: BLUE CROSS/BLUE SHIELD | Admitting: Obstetrics & Gynecology

## 2018-04-20 ENCOUNTER — Other Ambulatory Visit: Payer: Self-pay

## 2018-04-20 ENCOUNTER — Encounter: Payer: Self-pay | Admitting: Obstetrics & Gynecology

## 2018-04-20 VITALS — Ht 65.0 in

## 2018-04-20 DIAGNOSIS — N921 Excessive and frequent menstruation with irregular cycle: Secondary | ICD-10-CM

## 2018-04-20 NOTE — Progress Notes (Signed)
Virtual Visit via Telephone Note  I connected with Krystal Willis on 04/20/18 at  8:20 AM EDT by telephone and verified that I am speaking with the correct person using two identifiers.   I discussed the limitations, risks, security and privacy concerns of performing an evaluation and management service by telephone and the availability of in person appointments. I also discussed with the patient that there may be a patient responsible charge related to this service. The patient expressed understanding and agreed to proceed. She was at home and I was in my office.  History of Present Illness:  Krystal Willis is a 28 y.o. who was started on  Current Meds  Medication Sig  . SPRINTEC 28 0.25-35 MG-MCG tablet Take 1 tablet by mouth daily.   approximately 2 months ago. Since that time, she states that her symptoms were going well but then she missed 2 days worth of pills, and this led to severe BTB w clots and cramping.  She took 2 pills on the day she remembered and  Daily since then.  Sx's lasted for 3 days, still spotting some today (day 4).  Not worried about pregnancy (female partner), jts bleeding.  She was started on Sprintec bc it has worked in past, and she has risk factors for Depo, Nexpl, patch, and even IUD.  PMHx: She  has a past medical history of Menometrorrhagia and Osteochondroma. Also,  has a past surgical history that includes Knee arthroscopy with osteochondral defect repair (Left, 04/15/2015) and Osteochondroma excision (Left, 04/15/2015)., family history is not on file.,  reports that she quit smoking about 4 years ago. Her smoking use included cigarettes. She has never used smokeless tobacco. She reports current alcohol use. She reports that she does not use drugs. Current Meds  Medication Sig  . SPRINTEC 28 0.25-35 MG-MCG tablet Take 1 tablet by mouth daily.  . Also, has No Known Allergies..  Review of Systems  Constitutional: Negative for chills, fever and malaise/fatigue.   HENT: Negative for congestion, sinus pain and sore throat.   Eyes: Negative for blurred vision and pain.  Respiratory: Negative for cough and wheezing.   Cardiovascular: Negative for chest pain and leg swelling.  Gastrointestinal: Negative for abdominal pain, constipation, diarrhea, heartburn, nausea and vomiting.  Genitourinary: Negative for dysuria, frequency, hematuria and urgency.  Musculoskeletal: Negative for back pain, joint pain, myalgias and neck pain.  Skin: Negative for itching and rash.  Neurological: Negative for dizziness, tremors and weakness.  Endo/Heme/Allergies: Does not bruise/bleed easily.  Psychiatric/Behavioral: Negative for depression. The patient is not nervous/anxious and does not have insomnia.      Observations/Objective: No exam today, due to telephone eVisit due to [facility] virus restriction on elective visits and procedures.  Prior visits reviewed along with ultrasounds/labs as indicated.   Assessment and Plan: 1. Menometrorrhagia Cont Sprintec, try not to miss pills, and give 3-4 mos to see if improves cycles.  Especially as there not other great options for her (risk factor of obesity).  She agrees.  Follow Up Instructions: As needed after a longer trial of Sprintec.   I discussed the assessment and treatment plan with the patient. The patient was provided an opportunity to ask questions and all were answered. The patient agreed with the plan and demonstrated an understanding of the instructions.   The patient was advised to call back or seek an in-person evaluation if the symptoms worsen or if the condition fails to improve as anticipated.  I provided 8  minutes of non-face-to-face time during this encounter.   Hoyt Koch, MD [facility], [facility] 04/20/2018  11:35 AM

## 2018-04-22 NOTE — Telephone Encounter (Signed)
In the previous message [facility] said telephone visit.Krystal Willis

## 2018-04-22 NOTE — Telephone Encounter (Signed)
Does patient need to come in for office visit or schedule phone visit about prescription?

## 2018-04-22 NOTE — Telephone Encounter (Signed)
Patient is schedule 04/2118

## 2018-04-26 ENCOUNTER — Other Ambulatory Visit: Payer: Self-pay

## 2018-04-26 ENCOUNTER — Ambulatory Visit: Payer: BLUE CROSS/BLUE SHIELD | Admitting: Obstetrics & Gynecology

## 2018-05-10 DIAGNOSIS — Z713 Dietary counseling and surveillance: Secondary | ICD-10-CM | POA: Diagnosis not present

## 2018-05-12 DIAGNOSIS — R0602 Shortness of breath: Secondary | ICD-10-CM | POA: Diagnosis not present

## 2018-05-18 DIAGNOSIS — Z6841 Body Mass Index (BMI) 40.0 and over, adult: Secondary | ICD-10-CM | POA: Diagnosis not present

## 2018-05-27 DIAGNOSIS — Z9884 Bariatric surgery status: Secondary | ICD-10-CM | POA: Diagnosis not present

## 2018-05-27 DIAGNOSIS — K8020 Calculus of gallbladder without cholecystitis without obstruction: Secondary | ICD-10-CM | POA: Diagnosis not present

## 2018-06-08 DIAGNOSIS — Z713 Dietary counseling and surveillance: Secondary | ICD-10-CM | POA: Diagnosis not present

## 2018-06-08 DIAGNOSIS — G4733 Obstructive sleep apnea (adult) (pediatric): Secondary | ICD-10-CM | POA: Diagnosis not present

## 2018-07-05 DIAGNOSIS — Z6841 Body Mass Index (BMI) 40.0 and over, adult: Secondary | ICD-10-CM | POA: Diagnosis not present

## 2018-07-05 DIAGNOSIS — K8020 Calculus of gallbladder without cholecystitis without obstruction: Secondary | ICD-10-CM | POA: Diagnosis not present

## 2018-07-20 ENCOUNTER — Telehealth: Payer: Self-pay

## 2018-07-20 NOTE — Telephone Encounter (Signed)
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  [phone]

## 2018-07-20 NOTE — Telephone Encounter (Signed)
Can you refill this  For [facility] ?

## 2018-07-21 ENCOUNTER — Other Ambulatory Visit: Payer: Self-pay | Admitting: Maternal Newborn

## 2018-07-21 DIAGNOSIS — N926 Irregular menstruation, unspecified: Secondary | ICD-10-CM

## 2018-07-21 MED ORDER — MEDROXYPROGESTERONE ACETATE 10 MG PO TABS
10.0000 mg | ORAL_TABLET | Freq: Every day | ORAL | 0 refills | Status: DC
Start: 2018-07-21 — End: 2019-03-16

## 2018-07-21 NOTE — Telephone Encounter (Signed)
Refill sent, she should have a visit again if symptoms continue.

## 2018-07-21 NOTE — Telephone Encounter (Signed)
Left detailed msg refills sent in.

## 2018-08-02 ENCOUNTER — Telehealth: Payer: Self-pay

## 2018-08-02 NOTE — Telephone Encounter (Signed)
I think she should have a follow up appointment with Dr. Kenton Kingfisher.

## 2018-08-02 NOTE — Telephone Encounter (Signed)
Called pt to let her know [facility]'s adv.  Pt states she is on sprintec and she doesn't want the other options that have been discussed.  Adv will send msg to [facility] who is not in the office today but will be here tomorrow.  Pt was agreeable to msg being sent to [facility].

## 2018-08-02 NOTE — Telephone Encounter (Signed)
Pt calling; medroxy tab made bleeding worse; ? 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  Doesn't know what we need to do.  [phone]

## 2018-08-03 ENCOUNTER — Other Ambulatory Visit: Payer: Self-pay | Admitting: Obstetrics & Gynecology

## 2018-08-03 DIAGNOSIS — N921 Excessive and frequent menstruation with irregular cycle: Secondary | ICD-10-CM

## 2018-08-03 NOTE — Telephone Encounter (Signed)
Sch [facility] [place] and visit

## 2018-08-03 NOTE — Telephone Encounter (Signed)
Patient is schedule for 08/18/18 with [facility]

## 2018-08-08 DIAGNOSIS — K8020 Calculus of gallbladder without cholecystitis without obstruction: Secondary | ICD-10-CM | POA: Diagnosis not present

## 2018-08-11 DIAGNOSIS — K2970 Gastritis, unspecified, without bleeding: Secondary | ICD-10-CM | POA: Diagnosis not present

## 2018-08-11 DIAGNOSIS — Z6841 Body Mass Index (BMI) 40.0 and over, adult: Secondary | ICD-10-CM | POA: Diagnosis not present

## 2018-08-11 DIAGNOSIS — K259 Gastric ulcer, unspecified as acute or chronic, without hemorrhage or perforation: Secondary | ICD-10-CM | POA: Diagnosis not present

## 2018-08-11 DIAGNOSIS — G4730 Sleep apnea, unspecified: Secondary | ICD-10-CM | POA: Diagnosis not present

## 2018-08-18 ENCOUNTER — Other Ambulatory Visit: Payer: BC Managed Care – PPO

## 2018-08-18 ENCOUNTER — Ambulatory Visit: Payer: BLUE CROSS/BLUE SHIELD | Admitting: Obstetrics & Gynecology

## 2018-08-24 DIAGNOSIS — K8020 Calculus of gallbladder without cholecystitis without obstruction: Secondary | ICD-10-CM | POA: Diagnosis not present

## 2018-08-24 DIAGNOSIS — E559 Vitamin D deficiency, unspecified: Secondary | ICD-10-CM | POA: Diagnosis not present

## 2018-08-31 ENCOUNTER — Ambulatory Visit: Payer: BC Managed Care – PPO

## 2018-08-31 ENCOUNTER — Ambulatory Visit: Payer: BC Managed Care – PPO | Admitting: Obstetrics & Gynecology

## 2018-09-02 DIAGNOSIS — Z01818 Encounter for other preprocedural examination: Secondary | ICD-10-CM | POA: Diagnosis not present

## 2018-09-09 DIAGNOSIS — F17211 Nicotine dependence, cigarettes, in remission: Secondary | ICD-10-CM | POA: Diagnosis not present

## 2018-09-09 DIAGNOSIS — K8010 Calculus of gallbladder with chronic cholecystitis without obstruction: Secondary | ICD-10-CM | POA: Diagnosis not present

## 2018-09-09 DIAGNOSIS — E611 Iron deficiency: Secondary | ICD-10-CM | POA: Diagnosis not present

## 2018-09-09 DIAGNOSIS — K3189 Other diseases of stomach and duodenum: Secondary | ICD-10-CM | POA: Diagnosis not present

## 2018-09-09 DIAGNOSIS — R7303 Prediabetes: Secondary | ICD-10-CM | POA: Diagnosis not present

## 2018-09-09 DIAGNOSIS — K8020 Calculus of gallbladder without cholecystitis without obstruction: Secondary | ICD-10-CM | POA: Diagnosis not present

## 2018-09-09 DIAGNOSIS — R0683 Snoring: Secondary | ICD-10-CM | POA: Diagnosis not present

## 2018-09-09 DIAGNOSIS — N2889 Other specified disorders of kidney and ureter: Secondary | ICD-10-CM | POA: Diagnosis not present

## 2018-09-09 DIAGNOSIS — E559 Vitamin D deficiency, unspecified: Secondary | ICD-10-CM | POA: Diagnosis not present

## 2018-09-09 DIAGNOSIS — E538 Deficiency of other specified B group vitamins: Secondary | ICD-10-CM | POA: Diagnosis not present

## 2018-09-09 DIAGNOSIS — Z6841 Body Mass Index (BMI) 40.0 and over, adult: Secondary | ICD-10-CM | POA: Diagnosis not present

## 2018-09-16 ENCOUNTER — Ambulatory Visit: Payer: BC Managed Care – PPO | Admitting: Obstetrics & Gynecology

## 2018-09-16 ENCOUNTER — Ambulatory Visit: Payer: BC Managed Care – PPO

## 2018-09-22 DIAGNOSIS — Z9884 Bariatric surgery status: Secondary | ICD-10-CM | POA: Diagnosis not present

## 2018-09-22 DIAGNOSIS — Z713 Dietary counseling and surveillance: Secondary | ICD-10-CM | POA: Diagnosis not present

## 2018-09-26 DIAGNOSIS — Z23 Encounter for immunization: Secondary | ICD-10-CM | POA: Diagnosis not present

## 2018-11-15 DIAGNOSIS — Z9884 Bariatric surgery status: Secondary | ICD-10-CM | POA: Diagnosis not present

## 2018-11-15 DIAGNOSIS — G479 Sleep disorder, unspecified: Secondary | ICD-10-CM | POA: Diagnosis not present

## 2018-11-15 DIAGNOSIS — Z638 Other specified problems related to primary support group: Secondary | ICD-10-CM | POA: Diagnosis not present

## 2018-11-15 DIAGNOSIS — R45851 Suicidal ideations: Secondary | ICD-10-CM | POA: Diagnosis not present

## 2018-12-09 DIAGNOSIS — Z20828 Contact with and (suspected) exposure to other viral communicable diseases: Secondary | ICD-10-CM | POA: Diagnosis not present

## 2018-12-31 ENCOUNTER — Other Ambulatory Visit: Payer: Self-pay

## 2018-12-31 ENCOUNTER — Encounter: Payer: Self-pay | Admitting: Emergency Medicine

## 2018-12-31 ENCOUNTER — Emergency Department: Payer: BC Managed Care – PPO

## 2018-12-31 DIAGNOSIS — R202 Paresthesia of skin: Secondary | ICD-10-CM | POA: Diagnosis not present

## 2018-12-31 DIAGNOSIS — Z87891 Personal history of nicotine dependence: Secondary | ICD-10-CM | POA: Diagnosis not present

## 2018-12-31 DIAGNOSIS — R0789 Other chest pain: Secondary | ICD-10-CM | POA: Diagnosis not present

## 2018-12-31 DIAGNOSIS — R079 Chest pain, unspecified: Secondary | ICD-10-CM | POA: Diagnosis not present

## 2018-12-31 LAB — BASIC METABOLIC PANEL
Anion gap: 11 (ref 5–15)
BUN: <5 mg/dL — ABNORMAL LOW (ref 6–20)
CO2: 25 mmol/L (ref 22–32)
Calcium: 8.2 mg/dL — ABNORMAL LOW (ref 8.9–10.3)
Chloride: 104 mmol/L (ref 98–111)
Creatinine, Ser: 0.58 mg/dL (ref 0.44–1.00)
GFR calc Af Amer: >60 mL/min (ref >60)
GFR calc non Af Amer: >60 mL/min (ref >60)
Glucose, Bld: 89 mg/dL (ref 70–99)
Potassium: 3.4 mmol/L — ABNORMAL LOW (ref 3.5–5.1)
Sodium: 140 mmol/L (ref 135–145)

## 2018-12-31 LAB — CBC
HCT: 38.6 % (ref 36.0–46.0)
Hemoglobin: 12.6 g/dL (ref 12.0–15.0)
MCH: 26.3 pg (ref 26.0–34.0)
MCHC: 32.6 g/dL (ref 30.0–36.0)
MCV: 80.6 fL (ref 80.0–100.0)
Platelets: 354 10*3/uL (ref 150–400)
RBC: 4.79 MIL/uL (ref 3.87–5.11)
RDW: 17 % — ABNORMAL HIGH (ref 11.5–15.5)
WBC: 14.6 10*3/uL — ABNORMAL HIGH (ref 4.0–10.5)
nRBC: 0 % (ref 0.0–0.2)

## 2018-12-31 LAB — TROPONIN I (HIGH SENSITIVITY): Troponin I (High Sensitivity): <2 ng/L (ref <18)

## 2018-12-31 MED ORDER — SODIUM CHLORIDE 0.9% FLUSH: 3.0000 mL | Freq: Once | INTRAVENOUS | Status: DC

## 2018-12-31 NOTE — ED Triage Notes (Signed)
States chest pain since this am. States numbness waist to knees bilateral for 2 weeks. Slight SOB

## 2018-12-31 NOTE — ED Triage Notes (Signed)
FIRST NURSE NOTE:  Pt c/o chest pain and numbness in legs. Pt states the numbness has been going on x 2 weeks, pt states chest pain started today on the L side.

## 2019-01-01 ENCOUNTER — Emergency Department
Admission: EM | Admit: 2019-01-01 | Discharge: 2019-01-01 | Disposition: A | Discharge status: Home / Self Care | Payer: BC Managed Care – PPO | Attending: Emergency Medicine | Admitting: Emergency Medicine

## 2019-01-01 DIAGNOSIS — R202 Paresthesia of skin: Secondary | ICD-10-CM

## 2019-01-01 DIAGNOSIS — R0789 Other chest pain: Secondary | ICD-10-CM

## 2019-01-01 NOTE — ED Notes (Signed)
Pt states was diagnosed with covid 3 weeks ago. Pt states no longer feels shob or has fever. Pt complains of numbness from knees to umbilicus for "weeks". Pt states she has had left sided chest pain since yesterday. Pt appears in no acute distress.

## 2019-01-01 NOTE — Discharge Instructions (Addendum)
Your workup in the Emergency Department today was reassuring.  We did not find any specific abnormalities.  We recommend you drink plenty of fluids, take your regular medications and/or any new ones prescribed today, and follow up with the doctor(s) listed in these documents as recommended.  Return to the Emergency Department if you develop new or worsening symptoms that concern you.  

## 2019-01-01 NOTE — ED Provider Notes (Signed)
[facility] Emergency Department Provider Note  ____________________________________________   First MD Initiated Contact with Patient 01/01/19 [phone]     (approximate)  I have reviewed the triage vital signs and the nursing notes.   HISTORY  Chief Complaint Chest Pain and Numbness    HPI Krystal Willis is a 28 y.o. female with medical history as listed below who presents for evaluation of  some intermittent mild sharp chest pain over the course of the day.  She denies shortness of breath to me although she mentioned in triage.  She says she feels much better now after a long wait in the emergency department.  She also reports bilateral numbness from her waist to her knees but she said that this is intermittent and has been going on for months.  It is currently gone.  She has no difficulty with urination.  No back pain.  She was diagnosed with COVID-19 about 6 weeks ago and does not have any persistent symptoms of which she is aware.  Nothing in particular made the symptoms better or worse and she feels better now so she wants to go home.  No history of blood clot in the legs of the lungs.        Past Medical History:  Diagnosis Date  . Menometrorrhagia   . Osteochondroma    Left knee    Patient Active Problem List   Diagnosis Date Noted  . Menometrorrhagia 03/04/2018  . Intractable vomiting 02/09/2016    Past Surgical History:  Procedure Laterality Date  . KNEE ARTHROSCOPY WITH OSTEOCHONDRAL DEFECT REPAIR Left 04/15/2015   Procedure: LEFT KNEE ARTHROSCOPY WITH MEDIAL AND PATELLAR CHONDROPLASTY;  Surgeon: Netta Cedars, MD;  Location: [place];  Service: Orthopedics;  Laterality: Left;  . OSTEOCHONDROMA EXCISION Left 04/15/2015   Procedure: OPEN OSTEOCHONDROMA EXCISION;  Surgeon: Netta Cedars, MD;  Location: [place];  Service: Orthopedics;  Laterality: Left;    Prior to Admission medications   Medication Sig Start Date End Date Taking? Authorizing  Provider  azithromycin (ZITHROMAX) 250 MG tablet Take 1 tablet (250 mg total) by mouth daily. Patient not taking: Reported on 03/04/2018 10/30/16   Paulette Blanch, MD  benzonatate [facility] PERLES) 100 MG capsule Take 1-2 tabs TID prn cough Patient not taking: Reported on 03/04/2018 03/26/17   Menshew, Dannielle Karvonen, PA-C  fluticasone (FLONASE) 50 MCG/ACT nasal spray Place 2 sprays into both nostrils daily. Patient not taking: Reported on 03/04/2018 03/26/17   Menshew, Dannielle Karvonen, PA-C  medroxyPROGESTERone (PROVERA) 10 MG tablet Take 1 tablet (10 mg total) by mouth daily for 10 days. 07/21/18 07/31/18  Rexene Agent, CNM  SPRINTEC 28 0.25-35 MG-MCG tablet Take 1 tablet by mouth daily. 03/04/18 05/27/18  Gae Dry, MD    Allergies Patient has no known allergies.  No family history on file.  Social History Social History   Tobacco Use  . Smoking status: Former Smoker    Types: Cigarettes    Quit date: 01/05/2014    Years since quitting: 4.9  . Smokeless tobacco: Never Used  Substance Use Topics  . Alcohol use: Yes    Comment: occasional   . Drug use: No    Review of Systems Constitutional: No fever/chills Eyes: No visual changes. ENT: No sore throat. Cardiovascular: +chest pain. Respiratory: Denies shortness of breath. Gastrointestinal: No abdominal pain.  No nausea, no vomiting.  No diarrhea.  No constipation. Genitourinary: Negative for dysuria. Musculoskeletal: Negative for neck pain.  Negative  for back pain. Integumentary: Negative for rash. Neurological: Bilateral numbness from the waist to the knees, history of the same.   ____________________________________________   PHYSICAL EXAM:  VITAL SIGNS: ED Triage Vitals  Enc Vitals Group     BP 12/31/18 2256 (!) 151/75     Pulse Rate 12/31/18 2256 77     Resp 12/31/18 2256 20     Temp 12/31/18 2256 98.7 F (37.1 C)     Temp Source 12/31/18 2256 Oral     SpO2 12/31/18 2256 100 %     Weight 12/31/18 2258 (!)  140.6 kg (310 lb)     Height 12/31/18 2258 1.676 m (5\' 6" )     Head Circumference --      Peak Flow --      Pain Score 12/31/18 2258 8     Pain Loc --      Pain Edu? --      Excl. in [place]? --     Constitutional: Alert and oriented.  No acute distress. Eyes: Conjunctivae are normal.  Head: Atraumatic. Nose: No congestion/rhinnorhea. Mouth/Throat: Patient is wearing a mask. Neck: No stridor.  No meningeal signs.   Cardiovascular: Normal rate, regular rhythm. Good peripheral circulation. Grossly normal heart sounds. Respiratory: Normal respiratory effort.  No retractions. Gastrointestinal: Soft and nontender. No distention.  Musculoskeletal: No lower extremity tenderness nor edema. No gross deformities of extremities.  Ambulatory without difficulty. Neurologic:  Normal speech and language. No gross focal neurologic deficits are appreciated.  Skin:  Skin is warm, dry and intact. Psychiatric: Mood and affect are normal. Speech and behavior are normal.  ____________________________________________   LABS (all labs ordered are listed, but only abnormal results are displayed)  Labs Reviewed  BASIC METABOLIC PANEL - Abnormal; Notable for the following components:      Result Value   Potassium 3.4 (*)    BUN <5 (*)    Calcium 8.2 (*)    All other components within normal limits  CBC - Abnormal; Notable for the following components:   WBC 14.6 (*)    RDW 17.0 (*)    All other components within normal limits  TROPONIN I (HIGH SENSITIVITY)   ____________________________________________  EKG  ED ECG REPORT I, Hinda Kehr, the attending physician, personally viewed and interpreted this ECG.  Date: 12/31/2018 EKG Time: 22: 49 Rate: 78 Rhythm: normal sinus rhythm QRS Axis: normal Intervals: normal ST/T Wave abnormalities: Inverted T waves in lead III, otherwise unremarkable. Narrative Interpretation: no evidence of acute  ischemia  ____________________________________________  RADIOLOGY I, Hinda Kehr, personally viewed and evaluated these images (plain radiographs) as part of my medical decision making, as well as reviewing the written report by the radiologist.  ED MD interpretation: No acute abnormalities on chest x-ray  Official radiology report(s): DG Chest 2 View  Result Date: 12/31/2018 CLINICAL DATA:  Chest pain EXAM: CHEST - 2 VIEW COMPARISON:  October 29, 2016 FINDINGS: The heart size and mediastinal contours are within normal limits. Both lungs are clear. The visualized skeletal structures are unremarkable. IMPRESSION: No active cardiopulmonary disease. Electronically Signed   By: Prudencio Pair M.D.   On: 12/31/2018 23:38    ____________________________________________   PROCEDURES   Procedure(s) performed (including Critical Care):  Procedures   ____________________________________________   INITIAL IMPRESSION / MDM / [place] / ED COURSE  As part of my medical decision making, I reviewed the following data within the [place] notes reviewed and incorporated, Labs reviewed ,  EKG interpreted , Old chart reviewed, Radiograph reviewed  and Notes from prior ED visits   Differential diagnosis includes, but is not limited to, community-acquired pneumonia, COVID-19 (aftereffects if not acute infection), back injury including cord compression, electrolyte or metabolic abnormality, PE, musculoskeletal pain/strain.  Patient is PERC negative and low risk for ACS based on HEAR score.  No acute distress, states the symptoms have resolved and she wants to go home.  She has been in the emergency department almost 8 hours.  She has a mild leukocytosis of unclear significance but no chest x-ray findings, no fever, normal basic metabolic panel, and a high-sensitivity troponin of less than 2 with symptoms that have been going on for about 24 hours.  No indication for  repeat troponin.  Patient is comfortable with plan for discharge and outpatient follow-up.  She has no back pain has been having this intermittent bilateral leg paresthesia for months and she is not concerned about it.  No indication for emergent imaging.  I gave my usual customary return precautions.          ____________________________________________  FINAL CLINICAL IMPRESSION(S) / ED DIAGNOSES  Final diagnoses:  Atypical chest pain  Bilateral leg paresthesia     MEDICATIONS GIVEN DURING THIS VISIT:  Medications  sodium chloride flush (NS) 0.9 % injection 3 mL (has no administration in time range)     ED Discharge Orders    None      *Please note:  Krystal Willis was evaluated in Emergency Department on 01/01/2019 for the symptoms described in the history of present illness. She was evaluated in the context of the global COVID-19 pandemic, which necessitated consideration that the patient might be at risk for infection with the SARS-CoV-2 virus that causes COVID-19. Institutional protocols and algorithms that pertain to the evaluation of patients at risk for COVID-19 are in a state of rapid change based on information released by regulatory bodies including the CDC and federal and state organizations. These policies and algorithms were followed during the patient's care in the ED.  Some ED evaluations and interventions may be delayed as a result of limited staffing during the pandemic.*  Note:  This document was prepared using Dragon voice recognition software and may include unintentional dictation errors.   Hinda Kehr, MD 01/01/19 [phone]

## 2019-01-01 NOTE — ED Notes (Signed)
Per pt request, wife updated on pt status

## 2019-01-01 NOTE — ED Notes (Signed)
Pt updated regarding delay. Warm blanket provided.

## 2019-01-25 DIAGNOSIS — Z6841 Body Mass Index (BMI) 40.0 and over, adult: Secondary | ICD-10-CM | POA: Diagnosis not present

## 2019-01-25 DIAGNOSIS — K909 Intestinal malabsorption, unspecified: Secondary | ICD-10-CM | POA: Diagnosis not present

## 2019-01-25 DIAGNOSIS — Z9884 Bariatric surgery status: Secondary | ICD-10-CM | POA: Diagnosis not present

## 2019-02-20 ENCOUNTER — Other Ambulatory Visit: Payer: Self-pay

## 2019-02-20 ENCOUNTER — Other Ambulatory Visit: Payer: Self-pay | Admitting: Obstetrics & Gynecology

## 2019-02-20 MED ORDER — SPRINTEC 28 0.25-35 MG-MCG PO TABS: 1.0000 | ORAL_TABLET | Freq: Every day | ORAL | 0 refills | Status: DC

## 2019-02-20 NOTE — Telephone Encounter (Signed)
Sch Annual March Refill done

## 2019-02-20 NOTE — Telephone Encounter (Signed)
Patient is schedule for 03/16/19 with [facility]

## 2019-03-16 ENCOUNTER — Encounter: Payer: Self-pay | Admitting: Obstetrics & Gynecology

## 2019-03-16 ENCOUNTER — Other Ambulatory Visit: Payer: Self-pay

## 2019-03-16 ENCOUNTER — Ambulatory Visit (INDEPENDENT_AMBULATORY_CARE_PROVIDER_SITE_OTHER): Payer: BC Managed Care – PPO | Admitting: Obstetrics & Gynecology

## 2019-03-16 ENCOUNTER — Other Ambulatory Visit (HOSPITAL_COMMUNITY)
Admission: RE | Admit: 2019-03-16 | Discharge: 2019-03-16 | Disposition: A | Discharge status: Home / Self Care | Payer: BC Managed Care – PPO | Source: Ambulatory Visit | Attending: Obstetrics & Gynecology | Admitting: Obstetrics & Gynecology

## 2019-03-16 VITALS — BP 110/70 | Ht 66.0 in | Wt 271.0 lb

## 2019-03-16 DIAGNOSIS — N920 Excessive and frequent menstruation with regular cycle: Secondary | ICD-10-CM | POA: Diagnosis not present

## 2019-03-16 DIAGNOSIS — Z01419 Encounter for gynecological examination (general) (routine) without abnormal findings: Secondary | ICD-10-CM | POA: Diagnosis not present

## 2019-03-16 DIAGNOSIS — Z124 Encounter for screening for malignant neoplasm of cervix: Secondary | ICD-10-CM | POA: Insufficient documentation

## 2019-03-16 MED ORDER — ETONOGESTREL-ETHINYL ESTRADIOL 0.12-0.015 MG/24HR VA RING: VAGINAL_RING | VAGINAL | 3 refills | Status: DC

## 2019-03-16 NOTE — Patient Instructions (Signed)
Ethinyl Estradiol; Etonogestrel vaginal ring What is this medicine? ETHINYL ESTRADIOL; ETONOGESTREL (ETH in il es tra DYE ole; et oh noe JES trel) vaginal ring is a flexible, vaginal ring used as a contraceptive (birth control method). This medicine combines 2 types of female hormones, an estrogen and a progestin. This ring is used to prevent ovulation and pregnancy. Each ring is effective for 1 month. This medicine may be used for other purposes; ask your health care provider or pharmacist if you have questions. COMMON BRAND NAME(S): EluRyng, NuvaRing What should I tell my health care provider before I take this medicine? They need to know if you have any of these conditions:  abnormal vaginal bleeding  blood vessel disease or blood clots  breast, cervical, endometrial, ovarian, liver, or uterine cancer  diabetes  gallbladder disease  having surgery  heart disease or recent heart attack  high blood pressure  high cholesterol or triglycerides  history of irregular heartbeat or heart valve problems  kidney disease  liver disease  migraine headaches  protein C deficiency  protein S deficiency  recently had a baby, miscarriage, or abortion  stroke  systemic lupus erythematosus (SLE)  tobacco smoker  your age is more than 29 years old  an unusual or allergic reaction to estrogens, progestins, other medicines, foods, dyes, or preservatives  pregnant or trying to get pregnant  breast-feeding How should I use this medicine? Insert the ring into your vagina as directed. Follow the directions on the prescription label. The ring will remain place for 3 weeks and is then removed for a 1-week break. A new ring is inserted 1 week after the last ring was removed, on the same day of the week. Check often to make sure the ring is still in place. If the ring was out of the vagina for an unknown amount of time, you may not be protected from pregnancy. Perform a pregnancy test and  call your doctor. Do not use more often than directed. A patient package insert for the product will be given with each prescription and refill. Read this sheet carefully each time. The sheet may change frequently. Contact your pediatrician regarding the use of this medicine in children. Special care may be needed. Overdosage: If you think you have taken too much of this medicine contact a poison control center or emergency room at once. NOTE: This medicine is only for you. Do not share this medicine with others. What if I miss a dose? You will need to use the ring exactly as directed. It is very important to follow the schedule every cycle. If you do not use the ring as directed, you may not be protected from pregnancy. If the ring should slip out, is lost, or if you leave it in longer or shorter than you should, contact your health care professional for advice. What may interact with this medicine? Do not take this medicine with the following medications:  dasabuvir; ombitasvir; paritaprevir; ritonavir  ombitasvir; paritaprevir; ritonavir  vaginal lubricants or other vaginal products that are oil-based or silicone-based This medicine may also interact with the following medications:  acetaminophen  antibiotics or medicines for infections, especially rifampin, rifabutin, rifapentine, and griseofulvin, and possibly penicillins or tetracyclines  aprepitant or fosaprepitant  armodafinil  ascorbic acid (vitamin C)  barbiturate medicines, such as phenobarbital or primidone  bosentan  certain antiviral medicines for hepatitis, HIV or AIDS  certain medicines for cancer treatment  certain medicines for seizures like carbamazepine, clobazam, felbamate, lamotrigine, oxcarbazepine, phenytoin,   rufinamide, topiramate  certain medicines for treating high cholesterol  cyclosporine  dantrolene  elagolix  flibanserin  grapefruit juice  lesinurad  medicines for diabetes  medicines  to treat fungal infections, such as griseofulvin, miconazole, fluconazole, ketoconazole, itraconazole, posaconazole or voriconazole  mifepristone  mitotane  modafinil  morphine  mycophenolate  St. John's wort  tamoxifen  temazepam  theophylline or aminophylline  thyroid hormones  tizanidine  tranexamic acid  ulipristal  warfarin This list may not describe all possible interactions. Give your health care provider a list of all the medicines, herbs, non-prescription drugs, or dietary supplements you use. Also tell them if you smoke, drink alcohol, or use illegal drugs. Some items may interact with your medicine. What should I watch for while using this medicine? Visit your doctor or health care professional for regular checks on your progress. You will need a regular breast and pelvic exam and Pap smear while on this medicine. Check with your doctor or health care professional to see if you need an additional method of contraception during the first cycle that you use this ring. Female condoms (made with natural rubber latex, polyisoprene, and polyurethane) and spermicides may be used. Do not use a diaphragm, cervical cap, or a female condom, as the ring can interfere with these birth control methods and their proper placement. If you have any reason to think you are pregnant, stop using this medicine right away and contact your doctor or health care professional. If you are using this medicine for hormone related problems, it may take several cycles of use to see improvement in your condition. Smoking increases the risk of getting a blood clot or having a stroke while you are using hormonal birth control, especially if you are more than 29 years old. You are strongly advised not to smoke. Some women are prone to getting dark patches on the skin of the face (cholasma). Your risk of getting chloasma with this medicine is higher if you had chloasma during a pregnancy. Keep out of the  sun. If you cannot avoid being in the sun, wear protective clothing and use sunscreen. Do not use sun lamps or tanning beds/booths. This medicine can make your body retain fluid, making your fingers, hands, or ankles swell. Your blood pressure can go up. Contact your doctor or health care professional if you feel you are retaining fluid. If you are going to have elective surgery, you may need to stop using this medicine before the surgery. Consult your health care professional for advice. This medicine does not protect you against HIV infection (AIDS) or any other sexually transmitted diseases. What side effects may I notice from receiving this medicine? Side effects that you should report to your doctor or health care professional as soon as possible:  allergic reactions such as skin rash or itching, hives, swelling of the lips, mouth, tongue, or throat  depression  high blood pressure  migraines or severe, sudden headaches  signs and symptoms of a blood clot such as breathing problems; changes in vision; chest pain; severe, sudden headache; pain, swelling, warmth in the leg; trouble speaking; sudden numbness or weakness of the face, arm or leg  signs and symptoms of infection like fever or chills with dizziness and a sunburn-like rash, or pain or trouble passing urine  stomach pain  symptoms of vaginal infection like itching, irritation or unusual discharge  yellowing of the eyes or skin Side effects that usually do not require medical attention (report these to your doctor   or health care professional if they continue or are bothersome):  acne  breast pain, tenderness  irregular vaginal bleeding or spotting, particularly during the first month of use  mild headache  nausea  painful periods  vomiting This list may not describe all possible side effects. Call your doctor for medical advice about side effects. You may report side effects to FDA at 1-800-FDA-1088. Where should I  keep my medicine? Keep out of the reach of children. Store unopened medicine for up to 4 months at room temperature at 15 and 30 degrees C (59 and 86 degrees F). Protect from light. Do not store above 30 degrees C (86 degrees F). Throw away any unused medicine 4 months after the dispense date or the expiration date, whichever comes first. A ring may only be used for 1 cycle (1 month). After the 3-week cycle, a used ring is removed and should be placed in the re-closable foil pouch and discarded in the trash out of reach of children and pets. Do NOT flush down the toilet. NOTE: This sheet is a summary. It may not cover all possible information. If you have questions about this medicine, talk to your doctor, pharmacist, or health care provider.  2020 Elsevier/Gold Standard (2018-07-14 12:31:47)  

## 2019-03-16 NOTE — Progress Notes (Signed)
HPI:      [name] is a 29 y.o. G0P0000 who LMP was No LMP recorded. (Menstrual status: Irregular Periods)., she presents today for her annual examination. The patient has no complaints today. The patient is sexually active w females.  She is planning oocyte retrieval then ART for pregnancy w partner soon.  She has 2 yr old daughter by this means.  No risk for pregnancy, but takes OCPs for menorrhagia, has side effects of nausea at times and does not always take.  Recent weight loss surgery; has lost 146 lbs so far.  Her last pap: approximate date 2020 and was normal. The patient does perform self breast exams.  There is no notable family history of breast or ovarian cancer in her family.  The patient has regular exercise: yes.  The patient denies current symptoms of depression.    GYN History: Contraception: OCP (estrogen/progesterone) for period control only  PMHx: Past Medical History:  Diagnosis Date  . Menometrorrhagia   . Osteochondroma    Left knee   Past Surgical History:  Procedure Laterality Date  . KNEE ARTHROSCOPY WITH OSTEOCHONDRAL DEFECT REPAIR Left 04/15/2015   Procedure: LEFT KNEE ARTHROSCOPY WITH MEDIAL AND PATELLAR CHONDROPLASTY;  Surgeon: Netta Cedars, MD;  Location: [place];  Service: Orthopedics;  Laterality: Left;  . OSTEOCHONDROMA EXCISION Left 04/15/2015   Procedure: OPEN OSTEOCHONDROMA EXCISION;  Surgeon: Netta Cedars, MD;  Location: [place];  Service: Orthopedics;  Laterality: Left;   History reviewed. No pertinent family history. Social History   Tobacco Use  . Smoking status: Former Smoker    Types: Cigarettes    Quit date: 01/05/2014    Years since quitting: 5.1  . Smokeless tobacco: Never Used  Substance Use Topics  . Alcohol use: Yes    Comment: occasional   . Drug use: No    Current Outpatient Medications:  .  SPRINTEC 28 0.25-35 MG-MCG tablet, Take 1 tablet by mouth daily., Disp: 84 tablet, Rfl: 0 Allergies: Patient has no known  allergies.  Review of Systems  Constitutional: Negative for chills, fever and malaise/fatigue.  HENT: Negative for congestion, sinus pain and sore throat.   Eyes: Negative for blurred vision and pain.  Respiratory: Negative for cough and wheezing.   Cardiovascular: Negative for chest pain and leg swelling.  Gastrointestinal: Negative for abdominal pain, constipation, diarrhea, heartburn, nausea and vomiting.  Genitourinary: Negative for dysuria, frequency, hematuria and urgency.  Musculoskeletal: Negative for back pain, joint pain, myalgias and neck pain.  Skin: Negative for itching and rash.  Neurological: Negative for dizziness, tremors and weakness.  Endo/Heme/Allergies: Does not bruise/bleed easily.  Psychiatric/Behavioral: Negative for depression. The patient is not nervous/anxious and does not have insomnia.     Objective: There were no vitals taken for this visit. There were no vitals filed for this visit. There is no height or weight on file to calculate BMI. Physical Exam Constitutional:      General: She is not in acute distress.    Appearance: She is well-developed.  Genitourinary:     Pelvic exam was performed with patient supine.     Vagina, uterus and rectum normal.     No lesions in the vagina.     No vaginal bleeding.     No cervical motion tenderness, friability, lesion or polyp.     Uterus is mobile.     Uterus is not enlarged.     No uterine mass detected.    Uterus is midaxial.  No right or left adnexal mass present.     Right adnexa not tender.     Left adnexa not tender.  HENT:     Head: Normocephalic and atraumatic. No laceration.     Right Ear: Hearing normal.     Left Ear: Hearing normal.     Mouth/Throat:     Pharynx: Uvula midline.  Eyes:     Pupils: Pupils are equal, round, and reactive to light.  Neck:     Thyroid: No thyromegaly.  Cardiovascular:     Rate and Rhythm: Normal rate and regular rhythm.     Heart sounds: No murmur. No  friction rub. No gallop.   Pulmonary:     Effort: Pulmonary effort is normal. No respiratory distress.     Breath sounds: Normal breath sounds. No wheezing.  Chest:     Breasts:        Right: No mass, skin change or tenderness.        Left: No mass, skin change or tenderness.  Abdominal:     General: Bowel sounds are normal. There is no distension.     Palpations: Abdomen is soft.     Tenderness: There is no abdominal tenderness. There is no rebound.  Musculoskeletal:        General: Normal range of motion.     Cervical back: Normal range of motion and neck supple.  Neurological:     Mental Status: She is alert and oriented to person, place, and time.     Cranial Nerves: No cranial nerve deficit.  Skin:    General: Skin is warm and dry.  Psychiatric:        Judgment: Judgment normal.  Vitals reviewed.     Assessment:  ANNUAL EXAM 1. Women's annual routine gynecological examination   2. Screening for cervical cancer      Screening Plan:            1.  Cervical Screening-  Pap smear done today  2. Breast screening- Exam annually and mammogram>40 planned   3. Labs managed by PCP  4. Counseling for contraception: Change to [place] to minimize side effects and contol periods, also to keep hormones more cyclical for future oophcyste retrieval and ART.    F/U  Return in about 1 year (around 03/15/2020) for Annual.  Barnett Applebaum, MD, [facility], [facility] 03/16/2019  2:27 PM

## 2019-03-20 LAB — CYTOLOGY - PAP: Diagnosis: NEGATIVE

## 2019-04-05 DIAGNOSIS — R600 Localized edema: Secondary | ICD-10-CM | POA: Diagnosis not present

## 2019-04-05 DIAGNOSIS — Z9884 Bariatric surgery status: Secondary | ICD-10-CM | POA: Diagnosis not present

## 2019-04-05 DIAGNOSIS — K769 Liver disease, unspecified: Secondary | ICD-10-CM | POA: Diagnosis not present

## 2019-04-05 DIAGNOSIS — R1032 Left lower quadrant pain: Secondary | ICD-10-CM | POA: Diagnosis not present

## 2019-04-05 DIAGNOSIS — R109 Unspecified abdominal pain: Secondary | ICD-10-CM | POA: Diagnosis not present

## 2019-04-05 DIAGNOSIS — R1110 Vomiting, unspecified: Secondary | ICD-10-CM | POA: Diagnosis not present

## 2019-04-05 DIAGNOSIS — R1012 Left upper quadrant pain: Secondary | ICD-10-CM | POA: Diagnosis not present

## 2019-04-10 DIAGNOSIS — Z6841 Body Mass Index (BMI) 40.0 and over, adult: Secondary | ICD-10-CM | POA: Diagnosis not present

## 2019-04-10 DIAGNOSIS — R1084 Generalized abdominal pain: Secondary | ICD-10-CM | POA: Diagnosis not present

## 2019-04-10 DIAGNOSIS — Z9884 Bariatric surgery status: Secondary | ICD-10-CM | POA: Diagnosis not present

## 2019-05-03 DIAGNOSIS — Z20822 Contact with and (suspected) exposure to covid-19: Secondary | ICD-10-CM | POA: Diagnosis not present

## 2019-05-05 DIAGNOSIS — Z9884 Bariatric surgery status: Secondary | ICD-10-CM | POA: Diagnosis not present

## 2019-05-05 DIAGNOSIS — R1084 Generalized abdominal pain: Secondary | ICD-10-CM | POA: Diagnosis not present

## 2019-05-05 DIAGNOSIS — R1013 Epigastric pain: Secondary | ICD-10-CM | POA: Diagnosis not present

## 2019-05-05 DIAGNOSIS — E538 Deficiency of other specified B group vitamins: Secondary | ICD-10-CM | POA: Diagnosis not present

## 2019-05-05 DIAGNOSIS — K660 Peritoneal adhesions (postprocedural) (postinfection): Secondary | ICD-10-CM | POA: Diagnosis not present

## 2019-05-05 DIAGNOSIS — E559 Vitamin D deficiency, unspecified: Secondary | ICD-10-CM | POA: Diagnosis not present

## 2019-05-05 DIAGNOSIS — R109 Unspecified abdominal pain: Secondary | ICD-10-CM | POA: Diagnosis not present

## 2019-05-05 DIAGNOSIS — Z6841 Body Mass Index (BMI) 40.0 and over, adult: Secondary | ICD-10-CM | POA: Diagnosis not present

## 2019-05-05 DIAGNOSIS — E669 Obesity, unspecified: Secondary | ICD-10-CM | POA: Diagnosis not present

## 2019-05-23 IMAGING — CR DG CHEST 2V
2 series · 2 of 2 positions shown · non-contrast
Comparison: Chest radiograph dated 02/09/2016

CLINICAL DATA: 26-year-old female with productive cough and
hemoptysis.

EXAM:
CHEST  2 VIEW

[chest pa]
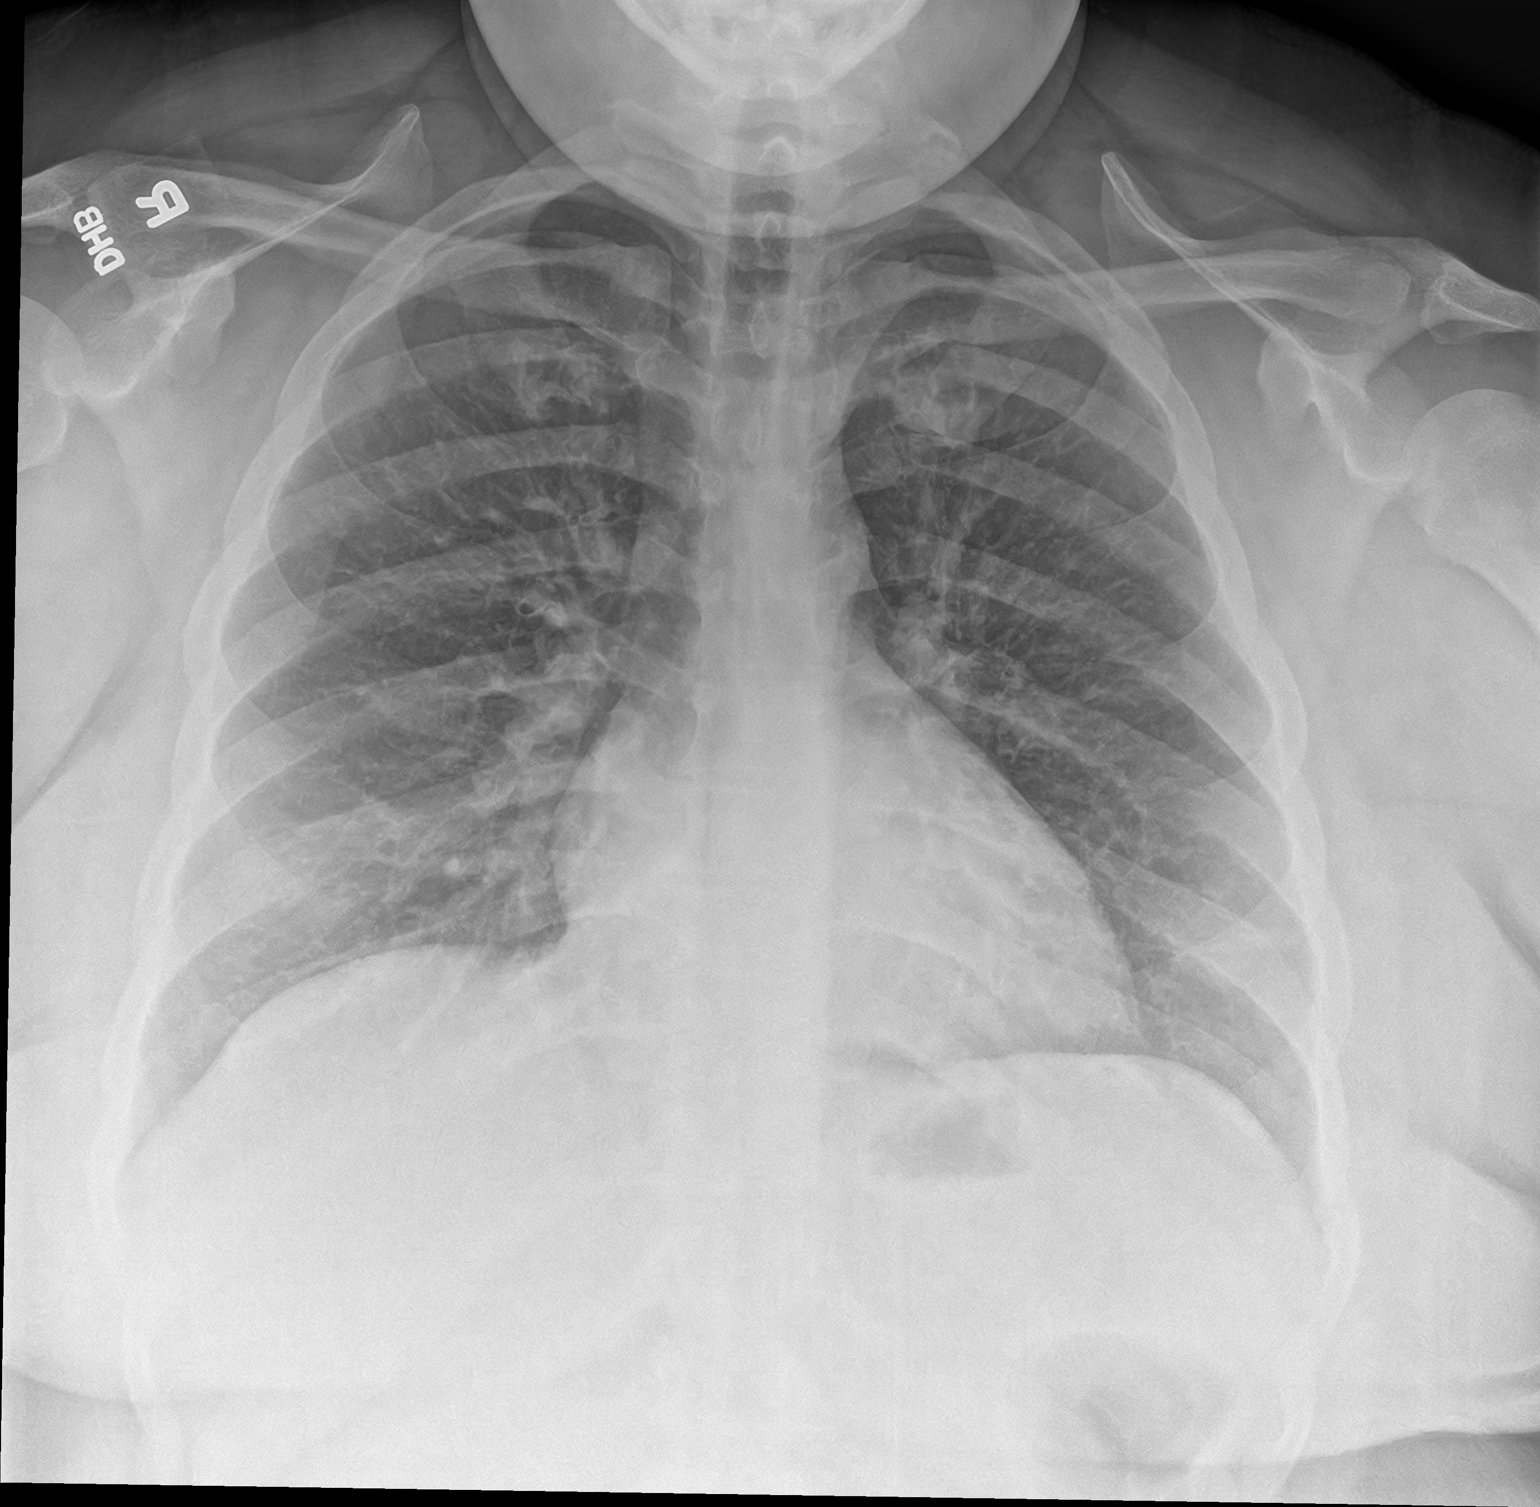

[chest lat]
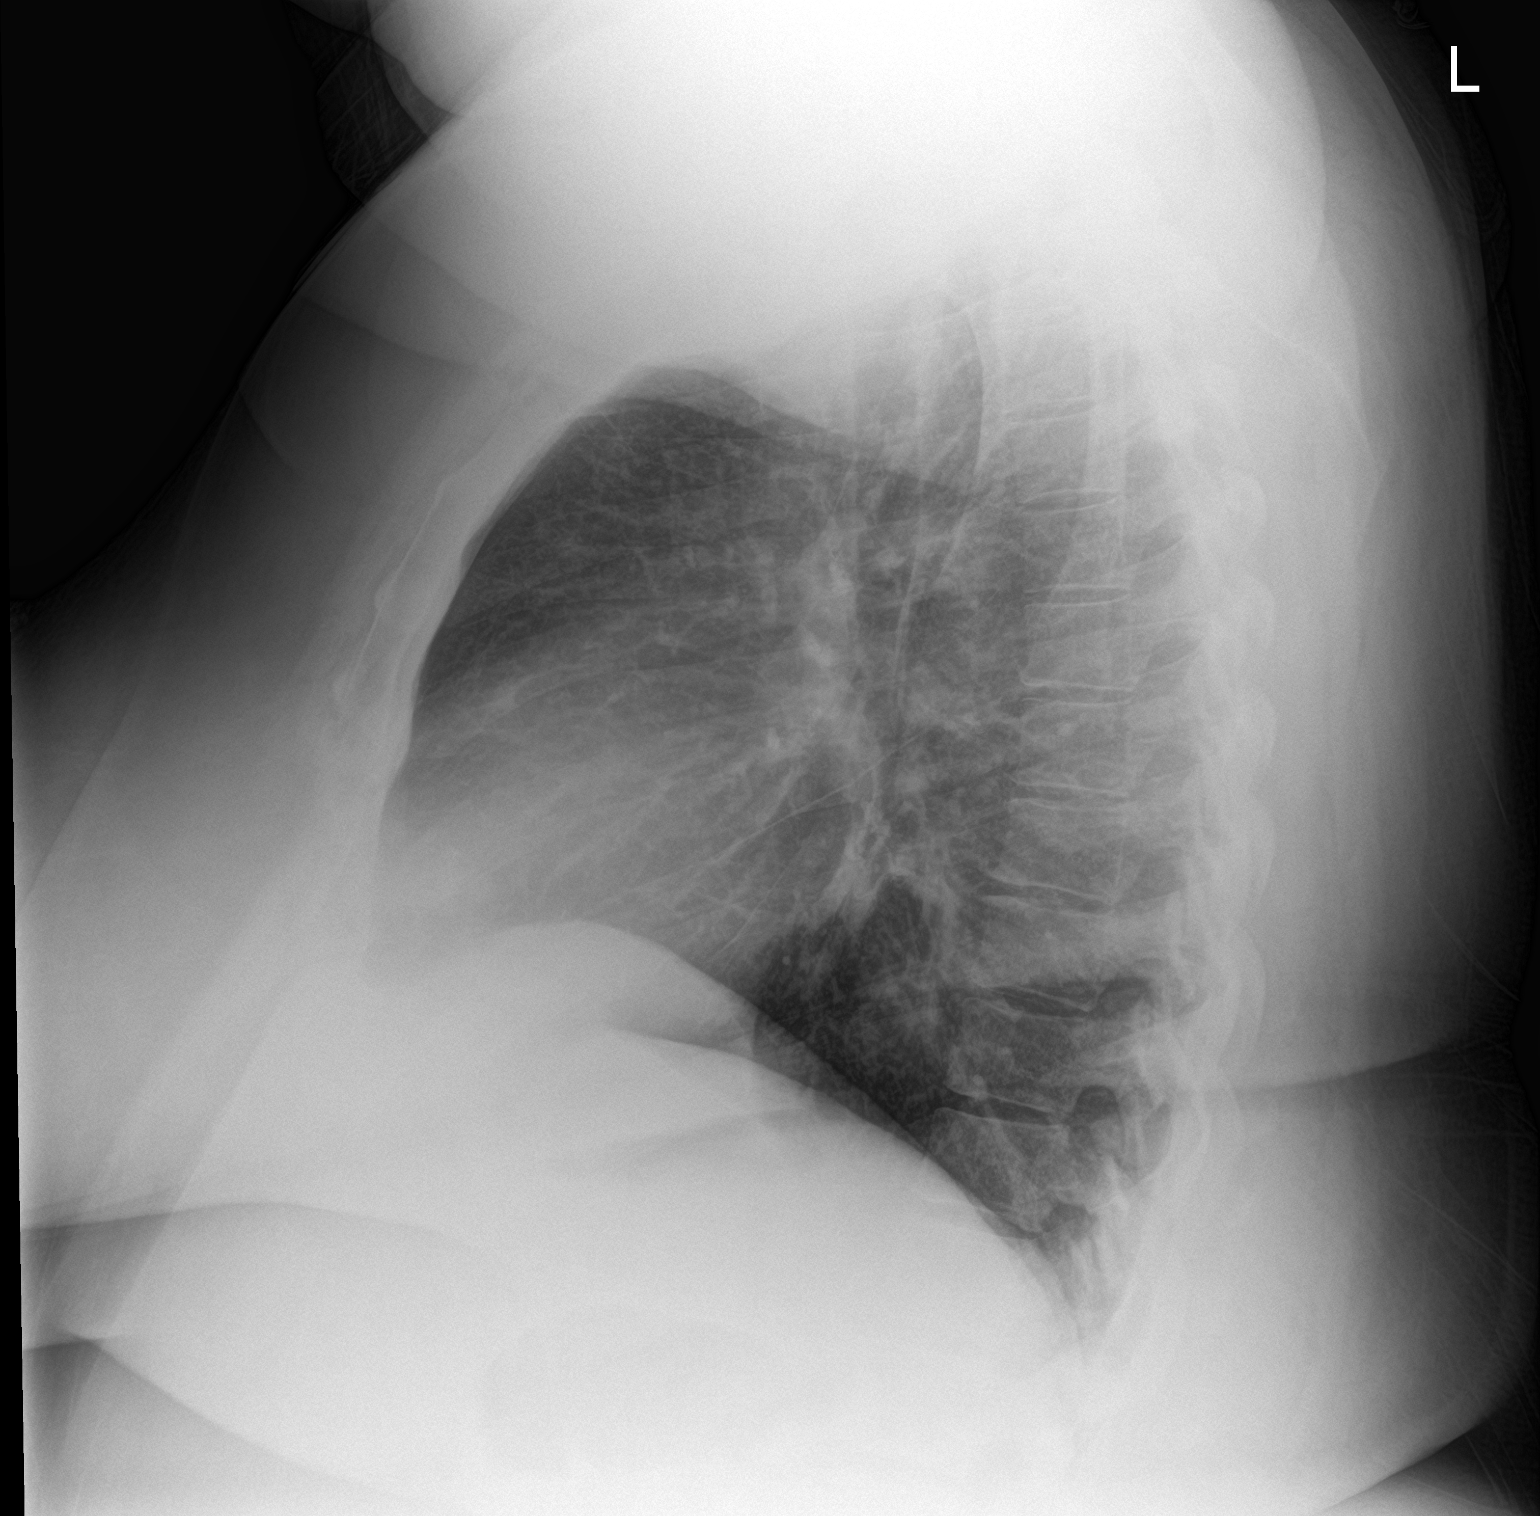

[2 of 2 positions shown; findings below may reference images not displayed]

FINDINGS: There is diffuse interstitial and peribronchial prominence which may
represent an atypical pneumonitis or less likely interstitial edema.
There is no focal consolidation. No pleural effusion or
pneumothorax. The cardiac silhouette is within normal limits. No
acute osseous pathology.
IMPRESSION: Findings likely represent an atypical pneumonitis and less likely
interstitial edema. Clinical correlation is recommended. No focal
consolidation.

## 2019-06-15 DIAGNOSIS — N979 Female infertility, unspecified: Secondary | ICD-10-CM | POA: Diagnosis not present

## 2019-07-22 ENCOUNTER — Other Ambulatory Visit: Payer: Self-pay

## 2019-07-22 ENCOUNTER — Encounter: Payer: Self-pay | Admitting: Emergency Medicine

## 2019-07-22 DIAGNOSIS — Z5321 Procedure and treatment not carried out due to patient leaving prior to being seen by health care provider: Secondary | ICD-10-CM | POA: Diagnosis not present

## 2019-07-22 DIAGNOSIS — R109 Unspecified abdominal pain: Secondary | ICD-10-CM | POA: Insufficient documentation

## 2019-07-22 LAB — CBC
HCT: 35.8 % — ABNORMAL LOW (ref 36.0–46.0)
Hemoglobin: 11.3 g/dL — ABNORMAL LOW (ref 12.0–15.0)
MCH: 28 pg (ref 26.0–34.0)
MCHC: 31.6 g/dL (ref 30.0–36.0)
MCV: 88.8 fL (ref 80.0–100.0)
Platelets: 291 10*3/uL (ref 150–400)
RBC: 4.03 MIL/uL (ref 3.87–5.11)
RDW: 14.1 % (ref 11.5–15.5)
WBC: 12.4 10*3/uL — ABNORMAL HIGH (ref 4.0–10.5)
nRBC: 0 % (ref 0.0–0.2)

## 2019-07-22 LAB — COMPREHENSIVE METABOLIC PANEL
ALT: 82 U/L — ABNORMAL HIGH (ref 0–44)
AST: 30 U/L (ref 15–41)
Albumin: 3.3 g/dL — ABNORMAL LOW (ref 3.5–5.0)
Alkaline Phosphatase: 68 U/L (ref 38–126)
Anion gap: 6 (ref 5–15)
BUN: 9 mg/dL (ref 6–20)
CO2: 26 mmol/L (ref 22–32)
Calcium: 8.1 mg/dL — ABNORMAL LOW (ref 8.9–10.3)
Chloride: 111 mmol/L (ref 98–111)
Creatinine, Ser: 0.77 mg/dL (ref 0.44–1.00)
GFR calc Af Amer: >60 mL/min (ref >60)
GFR calc non Af Amer: >60 mL/min (ref >60)
Glucose, Bld: 91 mg/dL (ref 70–99)
Potassium: 3.3 mmol/L — ABNORMAL LOW (ref 3.5–5.1)
Sodium: 143 mmol/L (ref 135–145)
Total Bilirubin: 0.6 mg/dL (ref 0.3–1.2)
Total Protein: 6.3 g/dL — ABNORMAL LOW (ref 6.5–8.1)

## 2019-07-22 LAB — LIPASE, BLOOD: Lipase: 21 U/L (ref 11–51)

## 2019-07-22 NOTE — ED Triage Notes (Signed)
Pt arrived via POV with reports of generalized abd pain x 2 days, c/o being nauseated, No vomiting or diarrhea at this time.

## 2019-07-22 NOTE — ED Notes (Signed)
Offered Zofran ODT, pt declined states it does not help.

## 2019-07-23 ENCOUNTER — Emergency Department
Admission: EM | Admit: 2019-07-23 | Discharge: 2019-07-23 | Disposition: A | Discharge status: Home / Self Care | Payer: BC Managed Care – PPO | Attending: Emergency Medicine | Admitting: Emergency Medicine

## 2019-07-23 NOTE — ED Notes (Signed)
Patient called for vital sign check with no answer. 

## 2019-07-24 ENCOUNTER — Telehealth: Payer: Self-pay | Admitting: Emergency Medicine

## 2019-07-24 NOTE — Telephone Encounter (Signed)
Called patient due to lwot to inquire about condition and follow up plans. Person answered, but then got cut off.

## 2019-08-04 DIAGNOSIS — Z113 Encounter for screening for infections with a predominantly sexual mode of transmission: Secondary | ICD-10-CM | POA: Diagnosis not present

## 2019-08-04 DIAGNOSIS — B259 Cytomegaloviral disease, unspecified: Secondary | ICD-10-CM | POA: Diagnosis not present

## 2019-08-04 DIAGNOSIS — N390 Urinary tract infection, site not specified: Secondary | ICD-10-CM | POA: Diagnosis not present

## 2019-08-10 ENCOUNTER — Ambulatory Visit: Payer: Self-pay

## 2019-08-11 ENCOUNTER — Encounter: Payer: Self-pay | Admitting: Emergency Medicine

## 2019-08-11 ENCOUNTER — Other Ambulatory Visit: Payer: Self-pay

## 2019-08-11 ENCOUNTER — Ambulatory Visit
Admission: EM | Admit: 2019-08-11 | Discharge: 2019-08-11 | Disposition: A | Discharge status: Home / Self Care | Payer: BC Managed Care – PPO | Attending: Family Medicine | Admitting: Family Medicine

## 2019-08-11 ENCOUNTER — Ambulatory Visit: Payer: Self-pay

## 2019-08-11 DIAGNOSIS — J988 Other specified respiratory disorders: Secondary | ICD-10-CM | POA: Diagnosis not present

## 2019-08-11 DIAGNOSIS — Z20822 Contact with and (suspected) exposure to covid-19: Secondary | ICD-10-CM | POA: Insufficient documentation

## 2019-08-11 LAB — SARS CORONAVIRUS 2 (TAT 6-24 HRS): SARS Coronavirus 2: NEGATIVE

## 2019-08-11 MED ORDER — ALBUTEROL SULFATE HFA 108 (90 BASE) MCG/ACT IN AERS: 1.0000 | INHALATION_SPRAY | Freq: Four times a day (QID) | RESPIRATORY_TRACT | 0 refills | Status: DC | PRN

## 2019-08-11 MED ORDER — BENZONATATE 200 MG PO CAPS: 200.0000 mg | ORAL_CAPSULE | Freq: Three times a day (TID) | ORAL | 0 refills | Status: DC | PRN

## 2019-08-11 MED ORDER — IPRATROPIUM BROMIDE 0.06 % NA SOLN: 2.0000 | Freq: Four times a day (QID) | NASAL | 0 refills | Status: DC | PRN

## 2019-08-11 NOTE — Discharge Instructions (Signed)
Covid test result should be available in the next 24 hours.  Medications as prescribed for symptomatic treatment.  Tylenol as needed for body aches and chills.  You can take 1000 mg 3 times a day as needed.  Lots of fluids and rest.  Take care  Dr. Lacinda Axon

## 2019-08-11 NOTE — ED Provider Notes (Signed)
[facility]    CSN: [id number] Arrival date & time: 08/11/19  1011      History   Chief Complaint Chief Complaint  Patient presents with  . Cough  . Nasal Congestion   HPI  29 year old female presents with multiple complaints.  Patient reports cough, runny nose, congestion, fevers, chills, body aches, vomiting, diarrhea.  Has not checked her temperature at home.  Illness started on Tuesday.  Significant other is also sick.  No relieving factors.  She is most bothered by the chills and body aches.  No other associated symptoms.  No reported encounters with COVID-19.  No other complaints.  Past Medical History:  Diagnosis Date  . Menometrorrhagia   . Osteochondroma    Left knee    Patient Active Problem List   Diagnosis Date Noted  . Menometrorrhagia 03/04/2018  . Intractable vomiting 02/09/2016    Past Surgical History:  Procedure Laterality Date  . BARIATRIC SURGERY    . KNEE ARTHROSCOPY WITH OSTEOCHONDRAL DEFECT REPAIR Left 04/15/2015   Procedure: LEFT KNEE ARTHROSCOPY WITH MEDIAL AND PATELLAR CHONDROPLASTY;  Surgeon: Netta Cedars, MD;  Location: [place];  Service: Orthopedics;  Laterality: Left;  . OSTEOCHONDROMA EXCISION Left 04/15/2015   Procedure: OPEN OSTEOCHONDROMA EXCISION;  Surgeon: Netta Cedars, MD;  Location: [place];  Service: Orthopedics;  Laterality: Left;    OB History    Gravida  0   Para  0   Term  0   Preterm  0   AB  0   Living  0     SAB  0   TAB  0   Ectopic  0   Multiple  0   Live Births               Home Medications    Prior to Admission medications   Medication Sig Start Date End Date Taking? Authorizing Provider  etonogestrel-ethinyl estradiol (NUVARING) 0.12-0.015 MG/24HR vaginal ring Insert vaginally and leave in place for 3 consecutive weeks, then remove for 1 week. 03/16/19  Yes Gae Dry, MD  albuterol (VENTOLIN HFA) 108 (90 Base) MCG/ACT inhaler Inhale 1-2 puffs into the lungs every 6 (six) hours  as needed for wheezing or shortness of breath. 08/11/19   Coral Spikes, DO  benzonatate (TESSALON) 200 MG capsule Take 1 capsule (200 mg total) by mouth 3 (three) times daily as needed for cough. 08/11/19   Thersa Salt G, DO  ipratropium (ATROVENT) 0.06 % nasal spray Place 2 sprays into both nostrils 4 (four) times daily as needed for rhinitis. 08/11/19   Coral Spikes, DO  SPRINTEC 28 0.25-35 MG-MCG tablet Take 1 tablet by mouth daily. 02/20/19 05/15/19  Gae Dry, MD    Family History History reviewed. No pertinent family history.  Social History Social History   Tobacco Use  . Smoking status: Former Smoker    Types: Cigarettes    Quit date: 01/05/2014    Years since quitting: 5.6  . Smokeless tobacco: Never Used  Vaping Use  . Vaping Use: Never used  Substance Use Topics  . Alcohol use: Yes    Comment: occasional   . Drug use: No     Allergies   Patient has no known allergies.   Review of Systems Review of Systems Per HPI  Physical Exam Triage Vital Signs ED Triage Vitals  Enc Vitals Group     BP 08/11/19 1051 (!) 133/96     Pulse Rate 08/11/19 1051 70  Resp 08/11/19 1051 14     Temp 08/11/19 1051 98.4 F (36.9 C)     Temp Source 08/11/19 1051 Oral     SpO2 08/11/19 1051 99 %     Weight 08/11/19 1049 218 lb (98.9 kg)     Height 08/11/19 1049 5\' 5"  (1.651 m)     Head Circumference --      Peak Flow --      Pain Score 08/11/19 1048 6     Pain Loc --      Pain Edu? --      Excl. in [place]? --    Updated Vital Signs BP (!) 133/96 (BP Location: Left Arm)   Pulse 70   Temp 98.4 F (36.9 C) (Oral)   Resp 14   Ht 5\' 5"  (1.651 m)   Wt 98.9 kg   LMP  (LMP Unknown)   SpO2 99%   BMI 36.28 kg/m   Visual Acuity Right Eye Distance:   Left Eye Distance:   Bilateral Distance:    Right Eye Near:   Left Eye Near:    Bilateral Near:     Physical Exam Vitals and nursing note reviewed.  Constitutional:      General: She is not in acute distress.     Appearance: Normal appearance. She is not ill-appearing.  HENT:     Head: Normocephalic and atraumatic.     Right Ear: Tympanic membrane normal.     Left Ear: Tympanic membrane normal.  Eyes:     General:        Right eye: No discharge.        Left eye: No discharge.     Conjunctiva/sclera: Conjunctivae normal.  Cardiovascular:     Rate and Rhythm: Normal rate and regular rhythm.     Heart sounds: No murmur heard.   Pulmonary:     Effort: Pulmonary effort is normal.     Breath sounds: Normal breath sounds. No wheezing, rhonchi or rales.  Neurological:     Mental Status: She is alert.  Psychiatric:        Mood and Affect: Mood normal.        Behavior: Behavior normal.    UC Treatments / Results  Labs (all labs ordered are listed, but only abnormal results are displayed) Labs Reviewed  SARS CORONAVIRUS 2 (TAT 6-24 HRS)    EKG   Radiology No results found.  Procedures Procedures (including critical care time)  Medications Ordered in UC Medications - No data to display  Initial Impression / Assessment and Plan / UC Course  I have reviewed the triage vital signs and the nursing notes.  Pertinent labs & imaging results that were available during my care of the patient were reviewed by me and considered in my medical decision making (see chart for details).    29 year old female presents with suspected viral illness.  Concern for possible COVID-19.  Awaiting test results.  Treating symptomatically with albuterol, Atrovent nasal spray, and Tessalon Perles.  Work note given.  Supportive care.  Final Clinical Impressions(s) / UC Diagnoses   Final diagnoses:  Respiratory infection  Encounter for laboratory testing for COVID-19 virus     Discharge Instructions     Covid test result should be available in the next 24 hours.  Medications as prescribed for symptomatic treatment.  Tylenol as needed for body aches and chills.  You can take 1000 mg 3 times a day as  needed.  Lots of fluids and rest.  Take care  Dr. Lacinda Axon     ED Prescriptions    Medication Sig Dispense Auth. Provider   albuterol (VENTOLIN HFA) 108 (90 Base) MCG/ACT inhaler Inhale 1-2 puffs into the lungs every 6 (six) hours as needed for wheezing or shortness of breath. 18 g Starnisha Batrez G, DO   ipratropium (ATROVENT) 0.06 % nasal spray Place 2 sprays into both nostrils 4 (four) times daily as needed for rhinitis. 15 mL Veora Fonte G, DO   benzonatate (TESSALON) 200 MG capsule Take 1 capsule (200 mg total) by mouth 3 (three) times daily as needed for cough. 30 capsule Coral Spikes, DO     PDMP not reviewed this encounter.   Coral Spikes, [place] 08/11/19 1151

## 2019-08-11 NOTE — ED Triage Notes (Signed)
Patient c/o cough, runny nose, vomiting and diarrhea that started on Tuesday.  Patient denies fevers.

## 2019-08-16 ENCOUNTER — Other Ambulatory Visit: Payer: Self-pay

## 2019-08-16 ENCOUNTER — Ambulatory Visit
Admission: EM | Admit: 2019-08-16 | Discharge: 2019-08-16 | Disposition: A | Discharge status: Home / Self Care | Payer: BC Managed Care – PPO

## 2019-08-16 DIAGNOSIS — K529 Noninfective gastroenteritis and colitis, unspecified: Secondary | ICD-10-CM | POA: Diagnosis not present

## 2019-08-16 NOTE — ED Triage Notes (Signed)
Pt seen here on 8/6 for same (n/v/d, headaches). Negative for COVID but still having symptoms. 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

## 2019-08-16 NOTE — ED Provider Notes (Signed)
[facility]    CSN: [id number] Arrival date & time: 08/16/19  1729      History   Chief Complaint Chief Complaint  Patient presents with  . Nausea  . Diarrhea    HPI Krystal Willis is a 29 y.o. female.   29 yr old female presents to UC for work note. Pt states she was seen here recently for same has had symptoms x5 days and work wont let her return. Partner has same symptoms.  The history is provided by the patient. No language interpreter was used.    Past Medical History:  Diagnosis Date  . Menometrorrhagia   . Osteochondroma    Left knee    Patient Active Problem List   Diagnosis Date Noted  . Gastroenteritis 08/16/2019  . Menometrorrhagia 03/04/2018  . Intractable vomiting 02/09/2016    Past Surgical History:  Procedure Laterality Date  . BARIATRIC SURGERY    . KNEE ARTHROSCOPY WITH OSTEOCHONDRAL DEFECT REPAIR Left 04/15/2015   Procedure: LEFT KNEE ARTHROSCOPY WITH MEDIAL AND PATELLAR CHONDROPLASTY;  Surgeon: Netta Cedars, MD;  Location: [place];  Service: Orthopedics;  Laterality: Left;  . OSTEOCHONDROMA EXCISION Left 04/15/2015   Procedure: OPEN OSTEOCHONDROMA EXCISION;  Surgeon: Netta Cedars, MD;  Location: [place];  Service: Orthopedics;  Laterality: Left;    OB History    Gravida  0   Para  0   Term  0   Preterm  0   AB  0   Living  0     SAB  0   TAB  0   Ectopic  0   Multiple  0   Live Births               Home Medications    Prior to Admission medications   Medication Sig Start Date End Date Taking? Authorizing Provider  albuterol (VENTOLIN HFA) 108 (90 Base) MCG/ACT inhaler Inhale 1-2 puffs into the lungs every 6 (six) hours as needed for wheezing or shortness of breath. 08/11/19   Coral Spikes, DO  benzonatate (TESSALON) 200 MG capsule Take 1 capsule (200 mg total) by mouth 3 (three) times daily as needed for cough. 08/11/19   Coral Spikes, DO  etonogestrel-ethinyl estradiol (NUVARING) 0.12-0.015 MG/24HR vaginal  ring Insert vaginally and leave in place for 3 consecutive weeks, then remove for 1 week. 03/16/19   Gae Dry, MD  ipratropium (ATROVENT) 0.06 % nasal spray Place 2 sprays into both nostrils 4 (four) times daily as needed for rhinitis. 08/11/19   Coral Spikes, DO  SPRINTEC 28 0.25-35 MG-MCG tablet Take 1 tablet by mouth daily. 02/20/19 05/15/19  Gae Dry, MD    Family History History reviewed. No pertinent family history.  Social History Social History   Tobacco Use  . Smoking status: Former Smoker    Types: Cigarettes    Quit date: 01/05/2014    Years since quitting: 5.6  . Smokeless tobacco: Never Used  Vaping Use  . Vaping Use: Never used  Substance Use Topics  . Alcohol use: Yes    Comment: occasional   . Drug use: No     Allergies   Patient has no known allergies.   Review of Systems Review of Systems  Constitutional: Negative for appetite change, chills and fever.  HENT: Negative.   Eyes: Negative.   Respiratory: Negative.   Cardiovascular: Negative for chest pain and palpitations.  Gastrointestinal: Positive for diarrhea and vomiting. Negative for abdominal pain.  Endocrine: Negative.   Genitourinary: Negative.   Musculoskeletal: Negative for back pain.  Skin: Negative for rash.  Allergic/Immunologic: Negative.   Neurological: Negative.   Hematological: Negative.   Psychiatric/Behavioral: Negative.   All other systems reviewed and are negative.    Physical Exam Triage Vital Signs ED Triage Vitals  Enc Vitals Group     BP 08/16/19 1746 131/81     Pulse Rate 08/16/19 1746 79     Resp 08/16/19 1746 16     Temp 08/16/19 1746 98.5 F (36.9 C)     Temp Source 08/16/19 1746 Oral     SpO2 08/16/19 1746 99 %     Weight 08/16/19 1744 217 lb 13 oz (98.8 kg)     Height 08/16/19 1744 5\' 5"  (1.651 m)     Head Circumference --      Peak Flow --      Pain Score 08/16/19 1744 5     Pain Loc --      Pain Edu? --      Excl. in [place]? --    No data  found.  Updated Vital Signs BP 131/81 (BP Location: Left Arm)   Pulse 79   Temp 98.5 F (36.9 C) (Oral)   Resp 16   Ht 5\' 5"  (1.651 m)   Wt 217 lb 13 oz (98.8 kg)   LMP  (LMP Unknown)   SpO2 99%   BMI 36.25 kg/m   Visual Acuity Right Eye Distance:   Left Eye Distance:   Bilateral Distance:    Right Eye Near:   Left Eye Near:    Bilateral Near:     Physical Exam Vitals and nursing note reviewed.  Constitutional:      General: She is not in acute distress.    Appearance: She is well-developed.  Eyes:     Pupils: Pupils are equal, round, and reactive to light.  Cardiovascular:     Rate and Rhythm: Normal rate and regular rhythm.     Heart sounds: Normal heart sounds.  Pulmonary:     Effort: Pulmonary effort is normal.     Breath sounds: Normal breath sounds.  Abdominal:     General: Bowel sounds are normal. There is no distension.     Palpations: Abdomen is soft.     Tenderness: There is no abdominal tenderness. There is no guarding or rebound.  Musculoskeletal:        General: Normal range of motion.     Cervical back: Normal range of motion.  Skin:    General: Skin is warm and dry.     Findings: No rash.  Neurological:     General: No focal deficit present.     Mental Status: She is alert and oriented to person, place, and time.     GCS: GCS eye subscore is 4. GCS verbal subscore is 5. GCS motor subscore is 6.  Psychiatric:        Behavior: Behavior normal.      UC Treatments / Results  Labs (all labs ordered are listed, but only abnormal results are displayed) Labs Reviewed - No data to display  EKG   Radiology No results found.  Procedures Procedures (including critical care time)  Medications Ordered in UC Medications - No data to display  Initial Impression / Assessment and Plan / UC Course  I have reviewed the triage vital signs and the nursing notes.  Pertinent labs & imaging results that were available during my care of  the patient  were reviewed by me and considered in my medical decision making (see chart for details).    Discussed with pt most likely this is viral, follow up with GI in [place], work note given. Has Zofran at home. Final Clinical Impressions(s) / UC Diagnoses   Final diagnoses:  Gastroenteritis     Discharge Instructions     Please follow up with your GI specialcist in [place]-call tomorrow for appt    ED Prescriptions    None     PDMP not reviewed this encounter.   Tori Milks, NP 26/83/41 [id number]

## 2019-08-16 NOTE — Discharge Instructions (Addendum)
Please follow up with your GI specialcist in [place]-call tomorrow for appt

## 2019-09-12 DIAGNOSIS — Z713 Dietary counseling and surveillance: Secondary | ICD-10-CM | POA: Diagnosis not present

## 2019-09-12 DIAGNOSIS — Z9884 Bariatric surgery status: Secondary | ICD-10-CM | POA: Diagnosis not present

## 2019-12-17 ENCOUNTER — Other Ambulatory Visit: Payer: Self-pay | Admitting: Obstetrics & Gynecology

## 2019-12-17 DIAGNOSIS — N920 Excessive and frequent menstruation with regular cycle: Secondary | ICD-10-CM

## 2020-03-25 ENCOUNTER — Other Ambulatory Visit: Payer: Self-pay | Admitting: Obstetrics & Gynecology

## 2020-03-25 DIAGNOSIS — N920 Excessive and frequent menstruation with regular cycle: Secondary | ICD-10-CM

## 2020-04-16 ENCOUNTER — Other Ambulatory Visit: Payer: Self-pay | Admitting: Obstetrics & Gynecology

## 2020-04-16 ENCOUNTER — Other Ambulatory Visit: Payer: Self-pay

## 2020-04-16 DIAGNOSIS — N920 Excessive and frequent menstruation with regular cycle: Secondary | ICD-10-CM

## 2020-04-16 MED ORDER — ETONOGESTREL-ETHINYL ESTRADIOL 0.12-0.015 MG/24HR VA RING: VAGINAL_RING | VAGINAL | 0 refills | Status: DC

## 2020-05-09 ENCOUNTER — Encounter: Payer: Self-pay | Admitting: Obstetrics & Gynecology

## 2020-05-09 ENCOUNTER — Ambulatory Visit (INDEPENDENT_AMBULATORY_CARE_PROVIDER_SITE_OTHER): Payer: BC Managed Care – PPO | Admitting: Obstetrics & Gynecology

## 2020-05-09 ENCOUNTER — Other Ambulatory Visit (HOSPITAL_COMMUNITY)
Admission: RE | Admit: 2020-05-09 | Discharge: 2020-05-09 | Disposition: A | Discharge status: Home / Self Care | Payer: BC Managed Care – PPO | Source: Ambulatory Visit | Attending: Obstetrics & Gynecology | Admitting: Obstetrics & Gynecology

## 2020-05-09 ENCOUNTER — Other Ambulatory Visit: Payer: Self-pay

## 2020-05-09 VITALS — BP 120/70 | Ht 65.0 in | Wt 206.0 lb

## 2020-05-09 DIAGNOSIS — N920 Excessive and frequent menstruation with regular cycle: Secondary | ICD-10-CM | POA: Diagnosis not present

## 2020-05-09 DIAGNOSIS — Z01419 Encounter for gynecological examination (general) (routine) without abnormal findings: Secondary | ICD-10-CM | POA: Diagnosis not present

## 2020-05-09 DIAGNOSIS — Z124 Encounter for screening for malignant neoplasm of cervix: Secondary | ICD-10-CM | POA: Diagnosis not present

## 2020-05-09 MED ORDER — ETONOGESTREL-ETHINYL ESTRADIOL 0.12-0.015 MG/24HR VA RING: VAGINAL_RING | VAGINAL | 3 refills | Status: DC

## 2020-05-09 NOTE — Progress Notes (Signed)
HPI:      Ms. HODA HON is a 30 y.o. G0P0000 who LMP was No LMP recorded. (Menstrual status: Irregular Periods)., she presents today for her annual examination. The patient has no complaints today.  She has los 200 lbs since surgery (goal weight 185). The patient is sexually active w female partner. Her last pap: was normal. The patient does perform self breast exams.  There is no notable family history of breast or ovarian cancer in her family.  The patient has regular exercise: yes.  The patient denies current symptoms of depression.    GYN History: Contraception: none needed (F partner)  Nuvaring for menorrhagia control  PMHx: Past Medical History:  Diagnosis Date  . Menometrorrhagia   . Osteochondroma    Left knee   Past Surgical History:  Procedure Laterality Date  . BARIATRIC SURGERY    . KNEE ARTHROSCOPY WITH OSTEOCHONDRAL DEFECT REPAIR Left 04/15/2015   Procedure: LEFT KNEE ARTHROSCOPY WITH MEDIAL AND PATELLAR CHONDROPLASTY;  Surgeon: Netta Cedars, MD;  Location: [place];  Service: Orthopedics;  Laterality: Left;  . OSTEOCHONDROMA EXCISION Left 04/15/2015   Procedure: OPEN OSTEOCHONDROMA EXCISION;  Surgeon: Netta Cedars, MD;  Location: [place];  Service: Orthopedics;  Laterality: Left;   History reviewed. No pertinent family history. Social History   Tobacco Use  . Smoking status: Former Smoker    Types: Cigarettes    Quit date: 01/05/2014    Years since quitting: 6.3  . Smokeless tobacco: Never Used  Vaping Use  . Vaping Use: Never used  Substance Use Topics  . Alcohol use: Yes    Comment: occasional   . Drug use: No    Current Outpatient Medications:  .  etonogestrel-ethinyl estradiol (NUVARING) 0.12-0.015 MG/24HR vaginal ring, Insert vaginally and leave in place for 3 consecutive weeks, then remove for 1 week., Disp: 3 each, Rfl: 3 Allergies: Patient has no known allergies.  Review of Systems  Constitutional: Negative for chills, fever and malaise/fatigue.   HENT: Negative for congestion, sinus pain and sore throat.   Eyes: Negative for blurred vision and pain.  Respiratory: Negative for cough and wheezing.   Cardiovascular: Negative for chest pain and leg swelling.  Gastrointestinal: Negative for abdominal pain, constipation, diarrhea, heartburn, nausea and vomiting.  Genitourinary: Negative for dysuria, frequency, hematuria and urgency.  Musculoskeletal: Negative for back pain, joint pain, myalgias and neck pain.  Skin: Negative for itching and rash.  Neurological: Negative for dizziness, tremors and weakness.  Endo/Heme/Allergies: Does not bruise/bleed easily.  Psychiatric/Behavioral: Negative for depression. The patient is not nervous/anxious and does not have insomnia.     Objective: BP 120/70   Ht 5\' 5"  (1.651 m)   Wt 206 lb (93.4 kg)   BMI 34.28 kg/m   Filed Weights   05/09/20 1347  Weight: 206 lb (93.4 kg)   Body mass index is 34.28 kg/m. Physical Exam Constitutional:      General: She is not in acute distress.    Appearance: She is well-developed.  Genitourinary:     Rectum normal.     No lesions in the vagina.     No vaginal bleeding.      Right Adnexa: not tender and no mass present.    Left Adnexa: not tender and no mass present.    No cervical motion tenderness, friability, lesion or polyp.     Uterus is not enlarged.     No uterine mass detected. Breasts:     Right: No mass, skin  change or tenderness.     Left: No mass, skin change or tenderness.    HENT:     Head: Normocephalic and atraumatic. No laceration.     Right Ear: Hearing normal.     Left Ear: Hearing normal.     Mouth/Throat:     Pharynx: Uvula midline.  Eyes:     Pupils: Pupils are equal, round, and reactive to light.  Neck:     Thyroid: No thyromegaly.  Cardiovascular:     Rate and Rhythm: Normal rate and regular rhythm.     Heart sounds: No murmur heard. No friction rub. No gallop.   Pulmonary:     Effort: Pulmonary effort is normal.  No respiratory distress.     Breath sounds: Normal breath sounds. No wheezing.  Abdominal:     General: Bowel sounds are normal. There is no distension.     Palpations: Abdomen is soft.     Tenderness: There is no abdominal tenderness. There is no rebound.  Musculoskeletal:        General: Normal range of motion.     Cervical back: Normal range of motion and neck supple.  Neurological:     Mental Status: She is alert and oriented to person, place, and time.     Cranial Nerves: No cranial nerve deficit.  Skin:    General: Skin is warm and dry.  Psychiatric:        Judgment: Judgment normal.  Vitals reviewed.     Assessment:  ANNUAL EXAM 1. Women's annual routine gynecological examination   2. Menorrhagia with regular cycle   3. Screening for cervical cancer      Screening Plan:            1.  Cervical Screening-  Pap smear done today  2. Breast screening- Exam annually and mammogram>40 planned   3. Colonoscopy every 10 years, Hemoccult testing - after age 37  4. Labs managed by PCP  5. Counseling for contraception: NuvaRing vaginal inserts, for menorrhagia  Plans ART for pregnancy w F partner after she completely loses weight from bariatrics (to 185 lbs) and has skin reduction surgery and recovery   6. Menorrhagia with regular cycle - cont hormone mgt - etonogestrel-ethinyl estradiol (NUVARING) 0.12-0.015 MG/24HR vaginal ring; Insert vaginally and leave in place for 3 consecutive weeks, then remove for 1 week.  Dispense: 3 each; Refill: 3     F/U  Return in about 1 year (around 05/09/2021) for Annual.  Barnett Applebaum, MD, [facility], [facility] 05/09/2020  2:05 PM

## 2020-05-13 LAB — CYTOLOGY - PAP
Comment: NEGATIVE
Diagnosis: NEGATIVE
High risk HPV: NEGATIVE

## 2020-06-07 ENCOUNTER — Other Ambulatory Visit: Payer: Self-pay | Admitting: Obstetrics & Gynecology

## 2020-06-07 DIAGNOSIS — N920 Excessive and frequent menstruation with regular cycle: Secondary | ICD-10-CM

## 2021-07-10 ENCOUNTER — Other Ambulatory Visit: Payer: Self-pay

## 2021-07-10 ENCOUNTER — Telehealth: Payer: Self-pay

## 2021-07-10 DIAGNOSIS — N920 Excessive and frequent menstruation with regular cycle: Secondary | ICD-10-CM

## 2021-07-10 MED ORDER — ETONOGESTREL-ETHINYL ESTRADIOL 0.12-0.015 MG/24HR VA RING: VAGINAL_RING | VAGINAL | 1 refills | Status: DC

## 2021-07-10 NOTE — Telephone Encounter (Signed)
Received a refill request for pts Nuvaring, I called her and left a message to schedule an annual. We have not seen her since May of 2022.

## 2021-07-24 IMAGING — CR DG CHEST 2V
2 series · 2 of 2 positions shown · non-contrast
Comparison: October 29, 2016

CLINICAL DATA: Chest pain

EXAM:
CHEST - 2 VIEW

[chest pa]
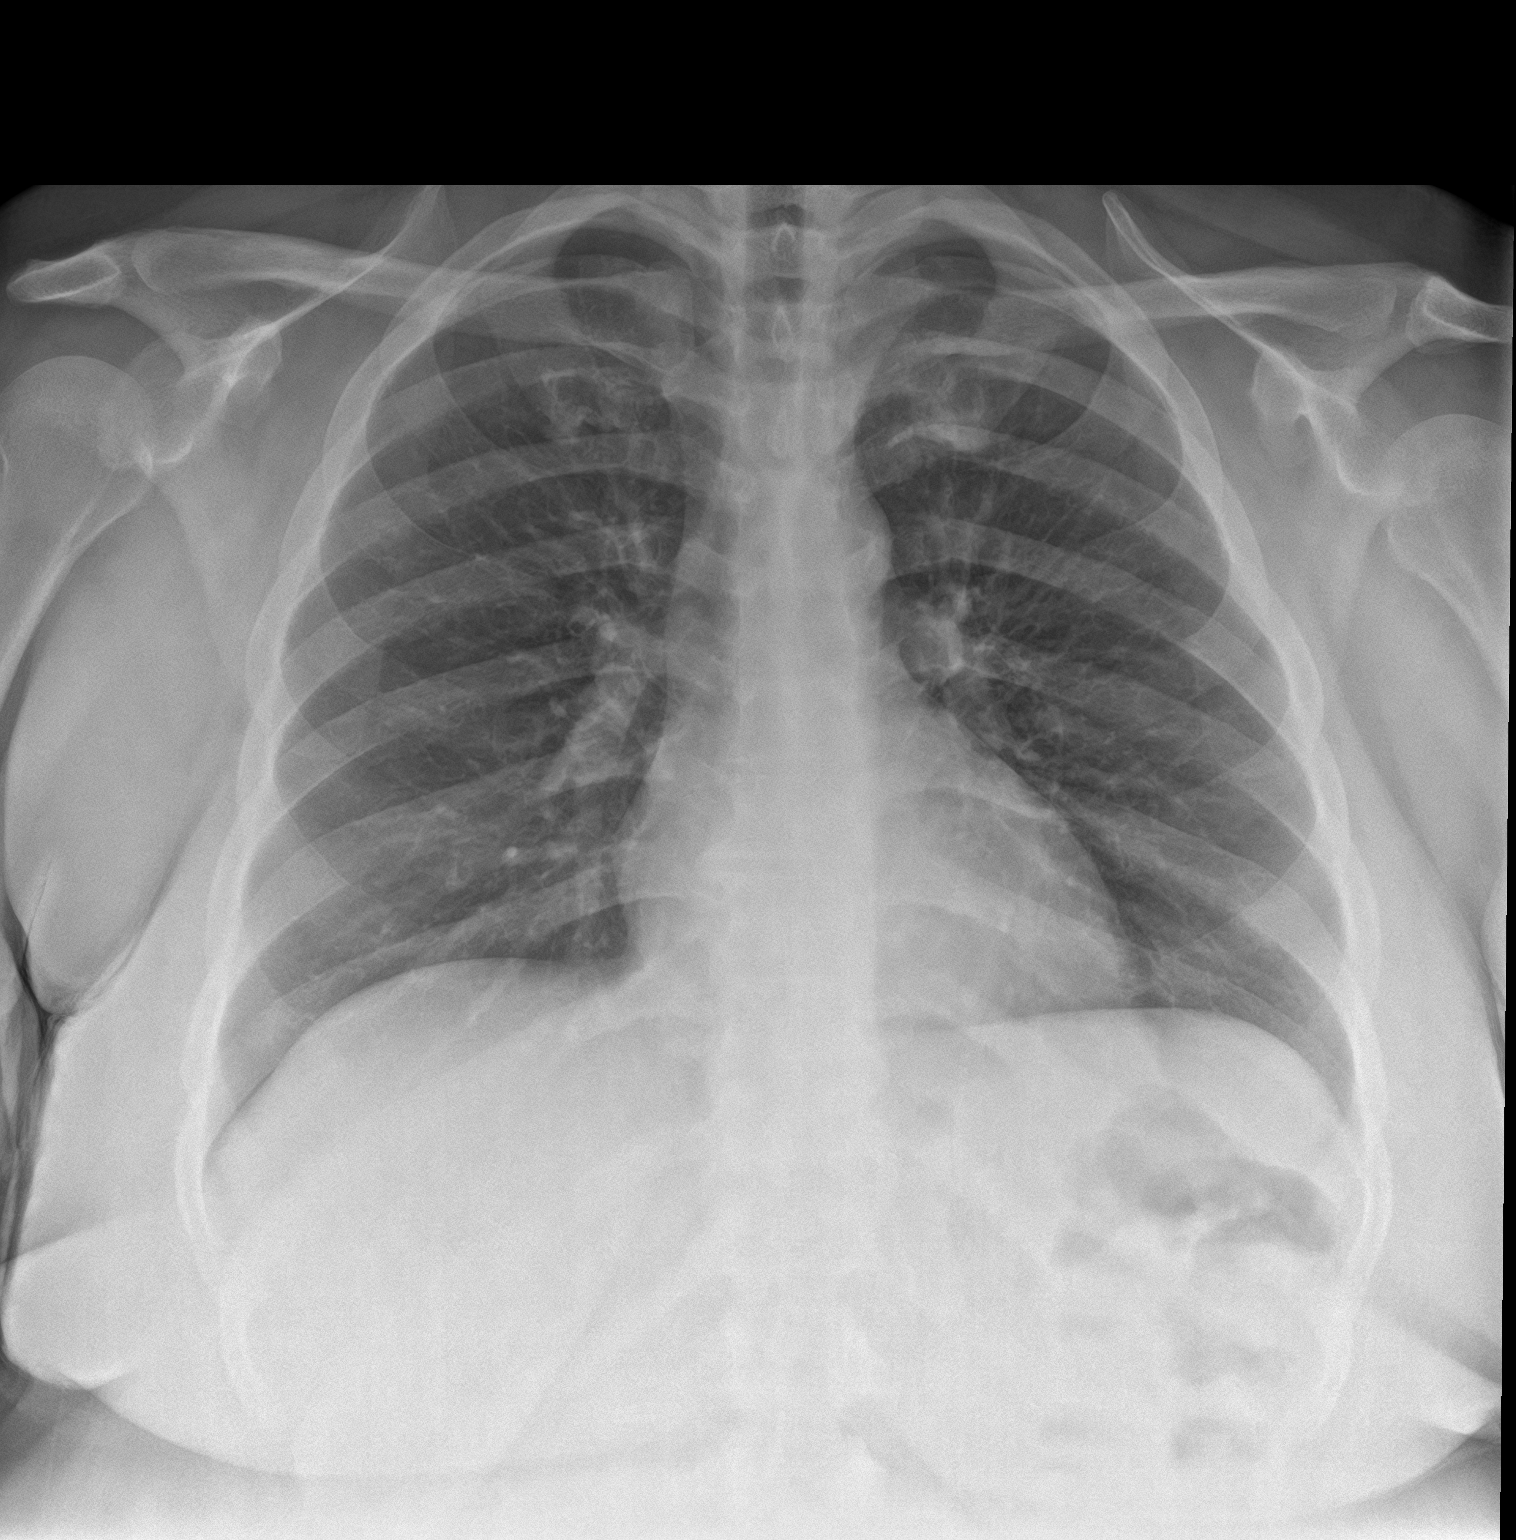

[chest lat]
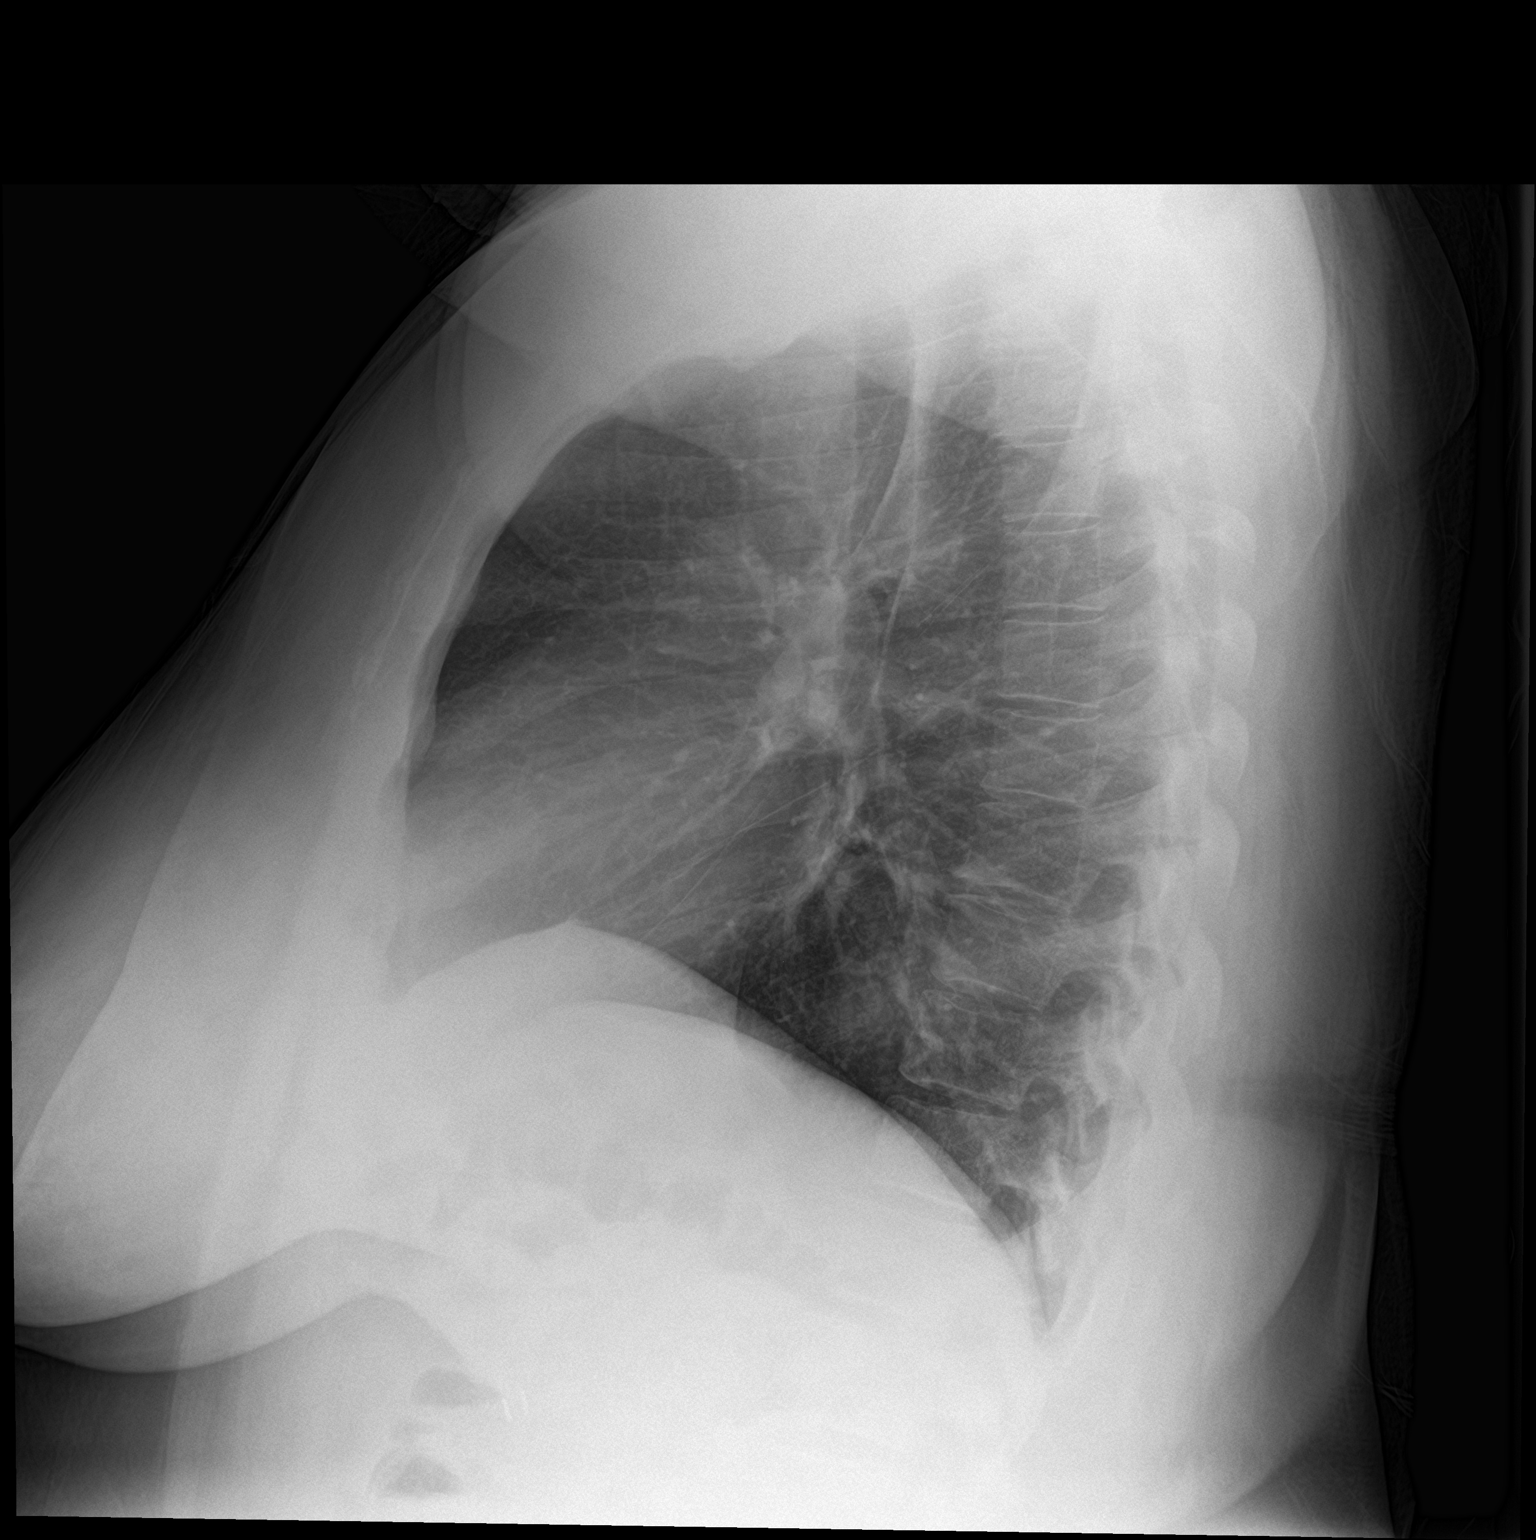

[2 of 2 positions shown; findings below may reference images not displayed]

FINDINGS: The heart size and mediastinal contours are within normal limits.
Both lungs are clear. The visualized skeletal structures are
unremarkable.
IMPRESSION: No active cardiopulmonary disease.

## 2021-08-28 ENCOUNTER — Other Ambulatory Visit: Payer: Self-pay | Admitting: Obstetrics

## 2021-08-28 ENCOUNTER — Ambulatory Visit: Payer: BC Managed Care – PPO | Admitting: Obstetrics

## 2021-08-28 DIAGNOSIS — N920 Excessive and frequent menstruation with regular cycle: Secondary | ICD-10-CM

## 2021-09-16 ENCOUNTER — Ambulatory Visit (INDEPENDENT_AMBULATORY_CARE_PROVIDER_SITE_OTHER): Payer: BC Managed Care – PPO | Admitting: Obstetrics & Gynecology

## 2021-09-16 ENCOUNTER — Encounter: Payer: Self-pay | Admitting: Obstetrics & Gynecology

## 2021-09-16 VITALS — BP 124/74 | Ht 65.0 in | Wt 226.0 lb

## 2021-09-16 DIAGNOSIS — Z01419 Encounter for gynecological examination (general) (routine) without abnormal findings: Secondary | ICD-10-CM | POA: Diagnosis not present

## 2021-09-16 DIAGNOSIS — N920 Excessive and frequent menstruation with regular cycle: Secondary | ICD-10-CM | POA: Diagnosis not present

## 2021-09-16 MED ORDER — ETONOGESTREL-ETHINYL ESTRADIOL 0.12-0.015 MG/24HR VA RING: VAGINAL_RING | VAGINAL | 5 refills | Status: AC

## 2021-09-16 NOTE — Progress Notes (Signed)
Subjective:    Krystal Willis is a 31 y.o. single G0 who presents for an annual exam. The patient has no complaints today. She uses Nuvaring to manage her periods. Without Nuvaring, she has irregular, heavy periods. She is in a same sex relationship.  GYN screening history: last pap: was normal. The patient wears seatbelts: yes. The patient participates in regular exercise: yes. Has the patient ever been transfused or tattooed?: yes. The patient reports that there is not domestic violence in her life.   Menstrual History: OB History     Gravida  0   Para  0   Term  0   Preterm  0   AB  0   Living  0      SAB  0   IAB  0   Ectopic  0   Multiple  0   Live Births              Menarche age: 66 No LMP recorded. (Menstrual status: Irregular Periods).    The following portions of the patient's history were reviewed and updated as appropriate: allergies, current medications, past family history, past medical history, past social history, past surgical history, and problem list.  Review of Systems Pertinent items are noted in HPI.  She has lost over 200# with weight loss surgery (including removal of a portion of her bowel and stomach and GB). 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[place], lives in [place]   Objective:    BP 124/74   Ht '5\' 5"'$  (1.651 m)   Wt 226 lb (102.5 kg)   BMI 37.61 kg/m   General Appearance:    Alert, cooperative, no distress, appears stated age  Head:    Normocephalic, without obvious abnormality, atraumatic  Eyes:    PERRL, conjunctiva/corneas clear, EOM's intact, fundi    benign, both eyes  Ears:    Normal TM's and external ear canals, both ears  Nose:   Nares normal, septum midline, mucosa normal, no drainage    or sinus tenderness  Throat:   Lips, mucosa, and tongue normal; teeth and gums normal  Neck:   Supple, symmetrical, trachea midline, no adenopathy;    thyroid:  no enlargement/tenderness/nodules; no carotid   bruit or JVD  Back:      Symmetric, no curvature, ROM normal, no CVA tenderness  Lungs:     Clear to auscultation bilaterally, respirations unlabored  Chest Wall:    No tenderness or deformity   Heart:    Regular rate and rhythm, S1 and S2 normal, no murmur, rub   or gallop  Breast Exam:    No tenderness, masses, or nipple abnormality  Abdomen:     Soft, non-tender, bowel sounds active all four quadrants,    no masses, no organomegaly  Genitalia:    Normal female without lesion, discharge or tenderness, normal size and shape, anteverted, mobile, non-tender, normal adnexal exam      Extremities:   Extremities normal, atraumatic, no cyanosis or edema  Pulses:   2+ and symmetric all extremities  Skin:   Skin color, texture, turgor normal, no rashes or lesions  Lymph nodes:   Cervical, supraclavicular, and axillary nodes normal  Neurologic:   CNII-XII intact, normal strength, sensation and reflexes    throughout  .    Assessment:    Healthy female exam.    Plan:     Menstrual management - refills of Nuvaring sent to her pharmacy She will be due for a  pap in 2 years.
